# Patient Record
Sex: Female | Born: 1980 | Race: Black or African American | Hispanic: No | Marital: Single | Smoking: Never smoker
Health system: Southern US, Community
[De-identification: ages and names within clinical notes are randomized; demographics above are authoritative.]

## PROBLEM LIST (undated history)

## (undated) ENCOUNTER — Inpatient Hospital Stay (HOSPITAL_COMMUNITY): Payer: Self-pay

## (undated) DIAGNOSIS — I10 Essential (primary) hypertension: Secondary | ICD-10-CM

## (undated) HISTORY — PX: LIPOMA EXCISION: SHX5283

---

## 2018-02-03 LAB — OB RESULTS CONSOLE RUBELLA ANTIBODY, IGM: Rubella: IMMUNE

## 2018-02-03 LAB — OB RESULTS CONSOLE HIV ANTIBODY (ROUTINE TESTING): HIV: NONREACTIVE

## 2018-02-07 LAB — OB RESULTS CONSOLE GC/CHLAMYDIA
Chlamydia: NEGATIVE
Gonorrhea: NEGATIVE

## 2018-03-30 NOTE — L&D Delivery Note (Signed)
Delivery Note At  a viable female was delivered via  (Presentation:OA ;  ).  APGAR:8 ,9 ; weight pending  .   Placenta status:complete , . 3v Cord:  with the following complications:heart shaped placenta .  Anesthesia:  Epidural Episiotomy:  None Lacerations:  None Suture Repair:NA Est. Blood Loss (mL):100cc    Mom to postpartum.  Baby to Couplet care / Skin to Skin.  Renee Ellis 07/28/2018, 12:55 AM

## 2018-04-13 ENCOUNTER — Encounter (HOSPITAL_COMMUNITY): Payer: Self-pay | Admitting: *Deleted

## 2018-04-13 ENCOUNTER — Other Ambulatory Visit: Payer: Self-pay

## 2018-04-13 ENCOUNTER — Inpatient Hospital Stay (HOSPITAL_COMMUNITY)
Admission: AD | Admit: 2018-04-13 | Discharge: 2018-04-13 | Disposition: A | Discharge status: Home / Self Care | Payer: 59 | Attending: Obstetrics and Gynecology | Admitting: Obstetrics and Gynecology

## 2018-04-13 DIAGNOSIS — O26872 Cervical shortening, second trimester: Secondary | ICD-10-CM | POA: Diagnosis not present

## 2018-04-13 DIAGNOSIS — Z3A24 24 weeks gestation of pregnancy: Secondary | ICD-10-CM | POA: Diagnosis not present

## 2018-04-13 DIAGNOSIS — Z369 Encounter for antenatal screening, unspecified: Secondary | ICD-10-CM | POA: Diagnosis not present

## 2018-04-13 DIAGNOSIS — Z362 Encounter for other antenatal screening follow-up: Secondary | ICD-10-CM | POA: Diagnosis not present

## 2018-04-13 DIAGNOSIS — Z3402 Encounter for supervision of normal first pregnancy, second trimester: Secondary | ICD-10-CM | POA: Diagnosis not present

## 2018-04-13 DIAGNOSIS — O26873 Cervical shortening, third trimester: Secondary | ICD-10-CM | POA: Insufficient documentation

## 2018-04-13 DIAGNOSIS — Z113 Encounter for screening for infections with a predominantly sexual mode of transmission: Secondary | ICD-10-CM | POA: Diagnosis not present

## 2018-04-13 DIAGNOSIS — Z3A34 34 weeks gestation of pregnancy: Secondary | ICD-10-CM | POA: Diagnosis not present

## 2018-04-13 DIAGNOSIS — N883 Incompetence of cervix uteri: Secondary | ICD-10-CM

## 2018-04-13 LAB — URINALYSIS, ROUTINE W REFLEX MICROSCOPIC
Bilirubin Urine: NEGATIVE
Glucose, UA: NEGATIVE mg/dL
Ketones, ur: 20 mg/dL — AB
LEUKOCYTES UA: NEGATIVE
Nitrite: NEGATIVE
Protein, ur: 30 mg/dL — AB
Specific Gravity, Urine: 1.025 (ref 1.005–1.030)
pH: 6 (ref 5.0–8.0)

## 2018-04-13 LAB — OB RESULTS CONSOLE GBS: GBS: POSITIVE

## 2018-04-13 MED ORDER — BETAMETHASONE SOD PHOS & ACET 6 (3-3) MG/ML IJ SUSP
12.0000 mg | Freq: Once | INTRAMUSCULAR | Status: AC
  Administered 2018-04-13: 12 mg via INTRAMUSCULAR
  Filled 2018-04-13: qty 2

## 2018-04-13 NOTE — Discharge Instructions (Signed)
Activity Restriction During Pregnancy °Your health care provider may recommend specific activity restrictions during pregnancy for a variety of reasons. Activity restriction may require that you limit activities that require great effort, such as exercise, lifting, or sex. °The type of activity restriction will vary for each person, depending on your risk or the problems you are having. Activity restriction may be recommended for a period of time until your baby is delivered. °Why are activity restrictions recommended? °Activity restriction may be recommended if: °· Your placenta is partially or completely covering the opening of your cervix (placenta previa). °· There is bleeding between the wall of the uterus and the amniotic sac in the first trimester of pregnancy (subchorionic hemorrhage). °· You went into labor too early (preterm labor). °· You have a history of miscarriage. °· You have a condition that causes high blood pressure during pregnancy (preeclampsia or eclampsia). °· You are pregnant with more than one baby. °· Your baby is not growing well. °What are the risks? °The risks depend on your specific restriction. Strict bed rest has the most physical and emotional risks and is no longer routinely recommended. Risks of strict bed rest include: °· Loss of muscle conditioning from not moving. °· Blood clots. °· Social isolation. °· Depression. °· Loss of income. °Talk with your health care team about activity restriction to decide if it is best for you and your baby. Even if you are having problems during your pregnancy, you may be able to continue with normal levels of activity with careful monitoring by your health care team. °Follow these instructions at home: °If needed, based on your overall health and the health of your baby, your health care provider will decide which type of activity restriction is right for you. Activity restrictions may include: °· Not lifting anything heavier than 10 pounds (4.5  kg). °· Avoiding activities that take a lot of physical effort. °· No lifting or straining. °· Resting in a sitting position or lying down for periods of time during the day. °Pelvic rest may be recommended along with activity restrictions. If pelvic rest is recommended, then: °· Do not have sex, an orgasm, or use sexual stimulation. °· Do not use tampons. Do not douche. Do not put anything into your vagina. °· Do not lift anything that is heavier than 10 lb (4.5 kg). °· Avoid activities that require a lot of effort. °· Avoid any activity in which your pelvic muscles could become strained, such as squatting. °Questions to ask your health care provider °· Why is my activity being limited? °· How will activity restrictions affect my body? °· Why is rest helpful for me and my baby? °· What activities can I do? °· When can I return to normal activities? °When should I seek immediate medical care? °Seek immediate medical care if you have: °· Vaginal bleeding. °· Vaginal discharge. °· Cramping pain in your lower abdomen. °· Regular contractions. °· A low, dull backache. °Summary °· Your health care provider may recommend specific activity restrictions during pregnancy for a variety of reasons. °· Activity restriction may require that you limit activities such as exercise, lifting, sex, or any other activity that requires great effort. °· Discuss the risks and benefits of activity restriction with your health care team to decide if it is best for you and your baby. °· Contact your health care provider right away if you think you are having contractions, or if you notice vaginal bleeding, discharge, or cramping. °This information is not   intended to replace advice given to you by your health care provider. Make sure you discuss any questions you have with your health care provider. °Document Released: 07/11/2010 Document Revised: 07/06/2017 Document Reviewed: 07/06/2017 °Elsevier Interactive Patient Education © 2019 Elsevier  Inc. ° ° °Cervical Insufficiency °Cervical insufficiency is when the cervix is weak and starts to open (dilate) and thin (efface) before the pregnancy is at term and before labor starts. This is also called incompetent cervix. It can happen during the second or third trimester when the fetus starts putting pressure on the cervix. Treatment may reduce the risk of problems for you and your baby. Cervical insufficiency can lead to: °· Loss of the baby (miscarriage). °· Breaking of the sac that holds the baby (amniotic sac). This is also called preterm premature rupture of the membranes,PPROM. °· The baby being born early (preterm birth). °What are the causes? °The cause of this condition is not well known. However, it may be caused by abnormalities in the cervix and other factors such as inflammation or infection. °What increases the risk? °This condition is more likely to develop if: °· You have a shorter cervix than normal. °· Your cervix was damaged or injured during a past pregnancy or surgery. °· You were born with a cervical defect. °· You have had a procedure done on the cervix, such as cervical biopsy. °· You have a history of: °? Cervical insufficiency. °? PPROM. °· You have ended several pregnancies through abortion. °· You were exposed to the drug diethylstilbestrol (DES). °What are the signs or symptoms? °Symptoms of this condition can vary. Sometimes there no symptoms for this condition, and at other times there are mild symptoms that start between weeks 14 and 20 of pregnancy. The symptoms may last several days or weeks. These symptoms include: °· Light spotting or bleeding from the vagina. °· Pelvic pressure. °· A change in vaginal discharge, such as changes from clear, white, or light yellow to pink or tan. °· Back pain. °· Abdominal pain or cramping. °How is this diagnosed? °This condition may be diagnosed based on: °· Your symptoms. °· Your medical history, including: °? Any problems during past  pregnancies, such as miscarriages. °? Any procedures performed on your cervix. °? Any history of cervical insufficiency. °During the second trimester, cervical insufficiency may be diagnosed based on: °· An ultrasound done with a probe inserted into your vagina (transvaginal ultrasound). °· A pelvic exam. °· Tests of fluid in the amniotic sac. This is done to rule out infection. °How is this treated? °This condition may be managed by: °· Limiting physical activity. °· Limiting activity at home or in the hospital. °· Pelvic rest. This means that there should be no sexual intercourse or placing anything in the vagina. °· A procedure to sew the cervix closed and prevent it from opening too early (cerclage). The stitches (sutures) are removed between weeks 36 and 38 to avoid problems during labor. Cerclage may be recommended if: °? You have a history of miscarriages or preterm births without a known cause. °? You have a short cervix. A short cervix is identified by ultrasound. °? Your cervix has dilated before 24 weeks of pregnancy. °Follow these instructions at home: °· Get plenty of rest and lessen activity as told by your health care provider. Ask your health care provider what activities are safe for you. °· If pelvic rest was recommended, you shouldnot have sex, use tampons, douche, or place anything inside your vagina until your health   care provider says that this is okay. °· Take over-the-counter and prescription medicines only as told by your health care provider. °· Keep all follow-up visits and prenatal visits as told by your health care provider. This is important. °Get help right away if: °· You have vaginal bleeding, even if it is a small amount or even if it is painless. °· You have pain in your abdomen or your lower back. °· You have a feeling of increased pressure in your pelvis. °· You have vaginal discharge that changes from clear, white, or light yellow to pink or tan. °· You have a fever. °· You have  severe nausea or vomiting. °Summary °· Cervical insufficiency is when the cervix is weak and starts to dilate and efface before the pregnancy is at term and before labor starts. °· Symptoms of this condition can vary from no symptoms to mild symptoms that start between weeks 14 and 20 of pregnancy. The symptoms may last several days or weeks. °· This condition may be managed by limiting physical activity, having pelvic rest, or having cervical cerclage. °· If pelvic rest was recommended, you should not have sex, use tampons, use a douche, or place anything inside your vagina until your health care provider says that this is okay. °This information is not intended to replace advice given to you by your health care provider. Make sure you discuss any questions you have with your health care provider. °Document Released: 03/16/2005 Document Revised: 03/19/2016 Document Reviewed: 03/19/2016 °Elsevier Interactive Patient Education © 2019 [employer]vier Inc. ° ° °Cervical Cerclage ° °Cervical cerclage is a surgical procedure to correct a cervix that opens up and thins out before pregnancy is at term (cervical insufficiency, also called incompetent cervix). This condition can cause labor to start early (prematurely). This procedure involves using stitches to sew the cervix shut during pregnancy. °Your surgeon may use ultrasound equipment to help guide the procedure and monitor your baby. Ultrasound equipment uses sound waves to take images of your cervix and uterus. Your surgeon will assess these images on a monitor in the operating room. °Tell a health care provider about: °· Any allergies you have, especially any allergies related to prescribed medicine, stitches, or anesthetic medicines. °· All medicines you are taking, including vitamins, herbs, eye drops, creams, and over-the-counter medicines. Bring a list of all of your medicines to your appointment. °· Your medical history, including prior labor deliveries. °· Any  problems you or family members have had with anesthetic medicines. °· Any blood disorders you have. °· Any surgeries you have had, including prior cervical stitching. °· Any medical conditions you have. °· Whether you are pregnant or may be pregnant. °What are the risks? °Generally, this is a safe procedure. However, problems may occur, including: °· Infection, such as infection of the cervix or amniotic sac. °· Vaginal bleeding. °· Allergic reactions to medicines. °· Damage to other structures or organs, such as tearing (rupture) of membranes or cervical laceration. °· Premature contractions including going into early labor and delivery. °· Cervical dystocia, which occurs when the cervix is unable to dilate normally during labor. °What happens before the procedure? °Staying hydrated °Follow instructions from your health care provider about hydration, which may include: °· Up to 2 hours before the procedure - you may continue to drink clear liquids, such as water, clear fruit juice, black coffee, and plain tea. °Eating and drinking restrictions °Follow instructions from your health care provider about eating and drinking, which may include: °· 8   hours before the procedure - stop eating heavy meals or foods such as meat, fried foods, or fatty foods. °· 6 hours before the procedure - stop eating light meals or foods, such as toast or cereal. °· 6 hours before the procedure - stop drinking milk or drinks that contain milk. °· 2 hours before the procedure - stop drinking clear liquids. °Medicines °· Ask your health care provider about: °? Changing or stopping your regular medicines. This is especially important if you are taking diabetes medicines or blood thinners. °? Taking medicines such as aspirin and ibuprofen. These medicines can thin your blood. Do not take these medicines before your procedure if your health care provider instructs you not to. °· You may be given antibiotic medicine to help prevent  infection. °General instructions °· Do not put on any lotion, deodorant, or perfume. °· Remove contact lenses and jewelry. °· Ask your health care provider how your surgical site will be marked or identified. °· You may have an exam or testing. °· You may have a blood or urine sample taken. °· Plan to have someone take you home from the hospital or clinic. °· If you will be going home right after the procedure, plan to have someone with you for 24 hours. °What happens during the procedure? °· To reduce your risk of infection: °? Your health care team will wash or sanitize their hands. °? Your skin will be washed with soap. °· An IV tube will be inserted into one of your veins. °· You may be given one or more of the following: °? A medicine to help you relax (sedative). °? A medicine to numb the area (local anesthetic). °? A medicine to make you fall asleep (general anesthetic). °? A medicine that is injected into your spine to numb the area below and slightly above the injection site (spinal anesthetic). °? A medicine that is injected into an area of your body to numb everything below the injection site (regional anesthetic). °· A lubricated instrument (speculum) will be inserted into your vagina. The speculum will be widened to open the walls of your vagina so your surgeon can see your cervix. °· Your cervix will be grasped and tightly stitched closed (sutured). To do this, your surgeon will stitch a strong band of thread around your cervix, then the thread will be tightened to hold your cervix shut. °The procedure may vary among health care providers and hospitals. °What happens after the procedure? °· Your blood pressure, heart rate, breathing rate, and blood oxygen level will be monitored until the medicines you were given have worn off. You will be monitored for premature contractions. °· You may have light bleeding and mild cramping. °· You may have to wear compression stockings. These stockings help to  prevent blood clots and reduce swelling in your legs. °· Do not drive for 24 hours if you received a sedative. °· You may be put on bed rest. °· You may be given medicine to prevent infection. °· You may be given an injection of a hormone (progesterone) to prevent your uterus from tightening (contracting). °Summary °· Cervical cerclage is a surgical procedure that involves using stitches to sew the cervix shut during pregnancy. °· Your blood pressure, heart rate, breathing rate, and blood oxygen level will be monitored until the medicines you were given have worn off. You will be monitored for premature contractions. °· You may need to be on bed rest after the procedure. °· Plan to have someone   take you home from the hospital or clinic. °This information is not intended to replace advice given to you by your health care provider. Make sure you discuss any questions you have with your health care provider. °Document Released: 02/27/2008 Document Revised: 11/08/2015 Document Reviewed: 10/31/2015 °Elsevier Interactive Patient Education © 2019 Elsevier Inc. ° °

## 2018-04-13 NOTE — MAU Provider Note (Addendum)
Chief Complaint:  Contractions   None    HPI: Renee CooperSheena Ellis is a 38 y.o. G3P2000 at 6652w4d who presents to maternity admissions reporting sent from [facility] office for short cervic 1.6cm, 1/30%/high, Dr Renee Ellis requested monitor for cxt, and B<Z to be started, pending Ffn and GBS at office. .  Denies contractions, leakage of fluid or vaginal bleeding. Good fetal movement.   Pregnancy Course:   History reviewed. No pertinent past medical history. OB History  Gravida Para Term Preterm AB Living  3 2 2         SAB TAB Ectopic Multiple Live Births               # Outcome Date GA Lbr Len/2nd Weight Sex Delivery Anes PTL Lv  3 Current           2 Term      Vag-Spont     1 Term      Vag-Spont      Past Surgical History:  Procedure Laterality Date  . LIPOMA EXCISION     History reviewed. No pertinent family history. Social History   Tobacco Use  . Smoking status: Never Smoker  . Smokeless tobacco: Never Used  Substance Use Topics  . Alcohol use: Not Currently    Comment: prior to pregnancy  . Drug use: Never   No Known Allergies No medications prior to admission.    I have reviewed patient's Past Medical Hx, Surgical Hx, Family Hx, Social Hx, medications and allergies.   ROS:  Review of Systems  Constitutional: Negative.   Respiratory: Negative.   Gastrointestinal: Negative.   Genitourinary: Negative.   Skin: Negative.   Neurological: Negative.   Hematological: Negative.   Psychiatric/Behavioral: Negative.     Physical Exam   Patient Vitals for the past 24 hrs:  BP Temp Temp src Pulse Resp SpO2 Weight  04/13/18 2014 118/74 - - 94 16 - -  04/13/18 1836 132/77 98.4 F (36.9 C) Oral (!) 104 17 100 % 86.8 kg   Constitutional: Well-developed, well-nourished female in no acute distress.  Cardiovascular: normal rate Respiratory: normal effort GI: Abd soft, non-tender, gravid appropriate for gestational age. Pos BS x 4 MS: Extremities nontender, no edema, normal  ROM Neurologic: Alert and oriented x 4.  GU: Neg CVAT.  Pelvic: NEFG, physiologic discharge, no blood. Pelvic adequate for labor. No CMT  Dilation: 1 Effacement (%): 30 Station: [employer] Exam by:: Eaton CorporationJade Rameen Ellis, CNM   NST: FHR baseline 125 bpm, Variability: moderate, Accelerations:present, Decelerations:  Absent= Cat 1/Reactive UC:   none SVE:   Dilation: 1 Effacement (%): 30 Station: [employer] Exam by:: Eaton CorporationJade Revonda Ellis, CNM,   Labs: Results for orders placed or performed during the hospital encounter of 04/13/18 (from the past 24 hour(s))  Urinalysis, Routine w reflex microscopic     Status: Abnormal   Collection Time: 04/13/18  7:03 PM  Result Value Ref Range   Color, Urine YELLOW YELLOW   APPearance CLEAR CLEAR   Specific Gravity, Urine 1.025 1.005 - 1.030   pH 6.0 5.0 - 8.0   Glucose, UA NEGATIVE NEGATIVE mg/dL   Hgb urine dipstick MODERATE (A) NEGATIVE   Bilirubin Urine NEGATIVE NEGATIVE   Ketones, ur 20 (A) NEGATIVE mg/dL   Protein, ur 30 (A) NEGATIVE mg/dL   Nitrite NEGATIVE NEGATIVE   Leukocytes, UA NEGATIVE NEGATIVE   RBC / HPF 6-10 0 - 5 RBC/hpf   WBC, UA 0-5 0 - 5 WBC/hpf   Bacteria, UA RARE (A) NONE  SEEN   Squamous Epithelial / LPF 0-5 0 - 5   Mucus PRESENT     Imaging:  No results found. [place] today at [facility]:  MAU Course: Orders Placed This Encounter  Procedures  . Urinalysis, Routine w reflex microscopic  . Diet - low sodium heart healthy  . Fetal monitoring  . Increase activity slowly  . Call MD for:  . Call MD for:  temperature >100.4  . Call MD for:  persistant nausea and vomiting  . Call MD for:  severe uncontrolled pain  . Call MD for:  redness, tenderness, or signs of infection (pain, swelling, redness, odor or green/yellow discharge around incision site)  . Call MD for:  difficulty breathing, headache or visual disturbances  . Call MD for:  hives  . Call MD for:  persistant dizziness or light-headedness  . Call MD for:  extreme fatigue  .  (HEART FAILURE PATIENTS) Call MD:  Anytime you have any of the following symptoms: 1) 3 pound weight gain in 24 hours or 5 pounds in 1 week 2) shortness of breath, with or without a dry hacking cough 3) swelling in the hands, feet or stomach 4) if you have to sleep on extra pillows at night in order to breathe.  . Discharge patient Discharge disposition: 01-Home or Self Care; Discharge patient date: 04/13/2018   Meds ordered this encounter  Medications  . betamethasone acetate-betamethasone sodium phosphate (CELESTONE) injection 12 mg    MDM: PE, [place] reviewed, VS reviewed, NSt reactive, no cxt, stable, discharged home, BMZ started.   Assessment: 1. Short cervix   No cxt, no cervical change, x1 dose bmz given tonight. NST reactive  Plan: Discharge home in stable condition.  Labor precautions and fetal kick counts Return for 2nd BMZ on 1/16 Pending Ffn, GBS.  Follow-up Information    [facility]. Call.   Specialty:  Obstetrics and Gynecology Why:  [facility], please f/u in 1 weeks for repeat US, and ROB. Please repurt to the MAU in 1 day 24 hours for second dose of betamethasone Contact information: [address] [place] [address] [phone]          Allergies as of 04/13/2018   No Known Allergies     Medication List    You have not been prescribed any medications.     [facility] NP-C, CNM [place], [place] 04/13/2018 8:27 PM

## 2018-04-13 NOTE — MAU Note (Signed)
Had normal dr's appt today.  Had [place], cervix looked a little contractions.  Dr told her cervix felt a little thin and a little open, and that she was having contractions, pt was unaware.  Sent over for further monitoring.  Started cramping after exam

## 2018-04-14 ENCOUNTER — Inpatient Hospital Stay (HOSPITAL_COMMUNITY)
Admission: AD | Admit: 2018-04-14 | Discharge: 2018-04-14 | Disposition: A | Discharge status: Home / Self Care | Payer: 59 | Attending: Obstetrics and Gynecology | Admitting: Obstetrics and Gynecology

## 2018-04-14 DIAGNOSIS — O26872 Cervical shortening, second trimester: Secondary | ICD-10-CM | POA: Insufficient documentation

## 2018-04-14 DIAGNOSIS — N883 Incompetence of cervix uteri: Secondary | ICD-10-CM

## 2018-04-14 DIAGNOSIS — Z3A25 25 weeks gestation of pregnancy: Secondary | ICD-10-CM | POA: Insufficient documentation

## 2018-04-14 MED ORDER — BETAMETHASONE SOD PHOS & ACET 6 (3-3) MG/ML IJ SUSP
12.0000 mg | Freq: Once | INTRAMUSCULAR | Status: AC
  Administered 2018-04-14: 12 mg via INTRAMUSCULAR
  Filled 2018-04-14: qty 2

## 2018-04-14 NOTE — MAU Note (Signed)
Pt here for 2nd BMZ. Pt denies contractions, LOF or vaginal bleeding. Reports good movement.

## 2018-04-18 DIAGNOSIS — O26879 Cervical shortening, unspecified trimester: Secondary | ICD-10-CM | POA: Diagnosis not present

## 2018-04-18 DIAGNOSIS — Z3A25 25 weeks gestation of pregnancy: Secondary | ICD-10-CM | POA: Diagnosis not present

## 2018-04-18 DIAGNOSIS — O402XX1 Polyhydramnios, second trimester, fetus 1: Secondary | ICD-10-CM | POA: Diagnosis not present

## 2018-05-10 DIAGNOSIS — O26879 Cervical shortening, unspecified trimester: Secondary | ICD-10-CM | POA: Diagnosis not present

## 2018-05-10 DIAGNOSIS — O403XX1 Polyhydramnios, third trimester, fetus 1: Secondary | ICD-10-CM | POA: Diagnosis not present

## 2018-05-10 DIAGNOSIS — Z3A28 28 weeks gestation of pregnancy: Secondary | ICD-10-CM | POA: Diagnosis not present

## 2018-06-02 DIAGNOSIS — O26879 Cervical shortening, unspecified trimester: Secondary | ICD-10-CM | POA: Diagnosis not present

## 2018-06-02 DIAGNOSIS — Z3A31 31 weeks gestation of pregnancy: Secondary | ICD-10-CM | POA: Diagnosis not present

## 2018-06-13 DIAGNOSIS — O09529 Supervision of elderly multigravida, unspecified trimester: Secondary | ICD-10-CM | POA: Diagnosis not present

## 2018-06-13 DIAGNOSIS — Z0183 Encounter for blood typing: Secondary | ICD-10-CM | POA: Diagnosis not present

## 2018-06-13 DIAGNOSIS — O10019 Pre-existing essential hypertension complicating pregnancy, unspecified trimester: Secondary | ICD-10-CM | POA: Diagnosis not present

## 2018-06-13 DIAGNOSIS — Z3A33 33 weeks gestation of pregnancy: Secondary | ICD-10-CM | POA: Diagnosis not present

## 2018-06-13 DIAGNOSIS — O43199 Other malformation of placenta, unspecified trimester: Secondary | ICD-10-CM | POA: Diagnosis not present

## 2018-06-13 DIAGNOSIS — O09899 Supervision of other high risk pregnancies, unspecified trimester: Secondary | ICD-10-CM | POA: Diagnosis not present

## 2018-06-27 DIAGNOSIS — Z3A35 35 weeks gestation of pregnancy: Secondary | ICD-10-CM | POA: Diagnosis not present

## 2018-06-27 DIAGNOSIS — O43199 Other malformation of placenta, unspecified trimester: Secondary | ICD-10-CM | POA: Diagnosis not present

## 2018-06-27 DIAGNOSIS — Z3493 Encounter for supervision of normal pregnancy, unspecified, third trimester: Secondary | ICD-10-CM | POA: Diagnosis not present

## 2018-07-13 DIAGNOSIS — O43199 Other malformation of placenta, unspecified trimester: Secondary | ICD-10-CM | POA: Diagnosis not present

## 2018-07-13 DIAGNOSIS — Z362 Encounter for other antenatal screening follow-up: Secondary | ICD-10-CM | POA: Diagnosis not present

## 2018-07-13 DIAGNOSIS — Z3A37 37 weeks gestation of pregnancy: Secondary | ICD-10-CM | POA: Diagnosis not present

## 2018-07-27 ENCOUNTER — Inpatient Hospital Stay (HOSPITAL_COMMUNITY): Payer: 59 | Admitting: Anesthesiology

## 2018-07-27 ENCOUNTER — Encounter (HOSPITAL_COMMUNITY): Payer: Self-pay | Admitting: *Deleted

## 2018-07-27 ENCOUNTER — Other Ambulatory Visit: Payer: Self-pay

## 2018-07-27 ENCOUNTER — Inpatient Hospital Stay (HOSPITAL_COMMUNITY)
Admission: AD | Admit: 2018-07-27 | Discharge: 2018-07-29 | DRG: 806 | Disposition: A | Discharge status: Home / Self Care | Payer: 59 | Attending: Obstetrics and Gynecology | Admitting: Obstetrics and Gynecology

## 2018-07-27 DIAGNOSIS — O99824 Streptococcus B carrier state complicating childbirth: Secondary | ICD-10-CM | POA: Diagnosis present

## 2018-07-27 DIAGNOSIS — O9081 Anemia of the puerperium: Secondary | ICD-10-CM | POA: Diagnosis not present

## 2018-07-27 DIAGNOSIS — Z3A39 39 weeks gestation of pregnancy: Secondary | ICD-10-CM

## 2018-07-27 DIAGNOSIS — O43123 Velamentous insertion of umbilical cord, third trimester: Secondary | ICD-10-CM | POA: Diagnosis present

## 2018-07-27 DIAGNOSIS — O164 Unspecified maternal hypertension, complicating childbirth: Secondary | ICD-10-CM | POA: Diagnosis not present

## 2018-07-27 DIAGNOSIS — O1002 Pre-existing essential hypertension complicating childbirth: Secondary | ICD-10-CM | POA: Diagnosis present

## 2018-07-27 DIAGNOSIS — O43193 Other malformation of placenta, third trimester: Secondary | ICD-10-CM | POA: Diagnosis present

## 2018-07-27 DIAGNOSIS — Z22330 Carrier of Group B streptococcus: Secondary | ICD-10-CM | POA: Diagnosis not present

## 2018-07-27 HISTORY — DX: Essential (primary) hypertension: I10

## 2018-07-27 LAB — CBC
HCT: 37.3 % (ref 36.0–46.0)
Hemoglobin: 12.1 g/dL (ref 12.0–15.0)
MCH: 28.3 pg (ref 26.0–34.0)
MCHC: 32.4 g/dL (ref 30.0–36.0)
MCV: 87.1 fL (ref 80.0–100.0)
Platelets: 281 10*3/uL (ref 150–400)
RBC: 4.28 MIL/uL (ref 3.87–5.11)
RDW: 14 % (ref 11.5–15.5)
WBC: 8.2 10*3/uL (ref 4.0–10.5)
nRBC: 0 % (ref 0.0–0.2)

## 2018-07-27 LAB — TYPE AND SCREEN
ABO/RH(D): O POS
Antibody Screen: NEGATIVE

## 2018-07-27 MED ORDER — FENTANYL-BUPIVACAINE-NACL 0.5-0.125-0.9 MG/250ML-% EP SOLN
12.0000 mL/h | EPIDURAL | Status: DC | PRN
  Filled 2018-07-27: qty 250

## 2018-07-27 MED ORDER — DIPHENHYDRAMINE HCL 50 MG/ML IJ SOLN: 12.5000 mg | INTRAMUSCULAR | Status: DC | PRN

## 2018-07-27 MED ORDER — EPHEDRINE 5 MG/ML INJ: 10.0000 mg | INTRAVENOUS | Status: DC | PRN

## 2018-07-27 MED ORDER — OXYTOCIN 40 UNITS IN NORMAL SALINE INFUSION - SIMPLE MED
1.0000 m[IU]/min | INTRAVENOUS | Status: DC
  Administered 2018-07-27: 2 m[IU]/min via INTRAVENOUS
  Filled 2018-07-27: qty 1000

## 2018-07-27 MED ORDER — PHENYLEPHRINE 40 MCG/ML (10ML) SYRINGE FOR IV PUSH (FOR BLOOD PRESSURE SUPPORT): 80.0000 ug | PREFILLED_SYRINGE | INTRAVENOUS | Status: DC | PRN

## 2018-07-27 MED ORDER — OXYCODONE-ACETAMINOPHEN 5-325 MG PO TABS: 2.0000 | ORAL_TABLET | ORAL | Status: DC | PRN

## 2018-07-27 MED ORDER — LACTATED RINGERS IV SOLN
500.0000 mL | Freq: Once | INTRAVENOUS | Status: AC
  Administered 2018-07-27: 500 mL via INTRAVENOUS

## 2018-07-27 MED ORDER — ONDANSETRON HCL 4 MG/2ML IJ SOLN: 4.0000 mg | Freq: Four times a day (QID) | INTRAMUSCULAR | Status: DC | PRN

## 2018-07-27 MED ORDER — TERBUTALINE SULFATE 1 MG/ML IJ SOLN: 0.2500 mg | Freq: Once | INTRAMUSCULAR | Status: DC | PRN

## 2018-07-27 MED ORDER — OXYTOCIN 40 UNITS IN NORMAL SALINE INFUSION - SIMPLE MED: 2.5000 [IU]/h | INTRAVENOUS | Status: DC

## 2018-07-27 MED ORDER — SOD CITRATE-CITRIC ACID 500-334 MG/5ML PO SOLN: 30.0000 mL | ORAL | Status: DC | PRN

## 2018-07-27 MED ORDER — ACETAMINOPHEN 325 MG PO TABS: 650.0000 mg | ORAL_TABLET | ORAL | Status: DC | PRN

## 2018-07-27 MED ORDER — SODIUM CHLORIDE 0.9 % IV SOLN
5.0000 10*6.[IU] | Freq: Once | INTRAVENOUS | Status: AC
  Administered 2018-07-27: 5 10*6.[IU] via INTRAVENOUS
  Filled 2018-07-27: qty 5

## 2018-07-27 MED ORDER — LIDOCAINE HCL (PF) 1 % IJ SOLN: 30.0000 mL | INTRAMUSCULAR | Status: DC | PRN

## 2018-07-27 MED ORDER — OXYCODONE-ACETAMINOPHEN 5-325 MG PO TABS: 1.0000 | ORAL_TABLET | ORAL | Status: DC | PRN

## 2018-07-27 MED ORDER — LACTATED RINGERS IV SOLN
INTRAVENOUS | Status: DC
  Administered 2018-07-27 (×2): via INTRAVENOUS

## 2018-07-27 MED ORDER — PENICILLIN G 3 MILLION UNITS IVPB - SIMPLE MED
3.0000 10*6.[IU] | INTRAVENOUS | Status: DC
  Administered 2018-07-27 (×2): 3 10*6.[IU] via INTRAVENOUS
  Filled 2018-07-27 (×2): qty 100

## 2018-07-27 MED ORDER — LIDOCAINE HCL (PF) 1 % IJ SOLN
INTRAMUSCULAR | Status: DC | PRN
  Administered 2018-07-27 (×2): 5 mL via EPIDURAL

## 2018-07-27 MED ORDER — SODIUM CHLORIDE (PF) 0.9 % IJ SOLN
INTRAMUSCULAR | Status: DC | PRN
  Administered 2018-07-27: 12 mL/h via EPIDURAL

## 2018-07-27 MED ORDER — LACTATED RINGERS IV SOLN: 500.0000 mL | INTRAVENOUS | Status: DC | PRN

## 2018-07-27 MED ORDER — OXYTOCIN BOLUS FROM INFUSION
500.0000 mL | Freq: Once | INTRAVENOUS | Status: AC
  Administered 2018-07-28: 500 mL via INTRAVENOUS

## 2018-07-27 MED ORDER — LACTATED RINGERS IV SOLN: 500.0000 mL | Freq: Once | INTRAVENOUS | Status: DC

## 2018-07-27 NOTE — Anesthesia Procedure Notes (Signed)
Epidural Patient location during procedure: OB Start time: 07/27/2018 6:26 PM End time: 07/27/2018 6:35 PM  Staffing Anesthesiologist: Achille Rich, MD Performed: anesthesiologist   Preanesthetic Checklist Completed: patient identified, site marked, pre-op evaluation, timeout performed, IV checked, risks and benefits discussed and monitors and equipment checked  Epidural Patient position: sitting Prep: DuraPrep Patient monitoring: heart rate, cardiac monitor, continuous pulse ox and blood pressure Approach: midline Location: L2-L3 Injection technique: LOR saline  Needle:  Needle type: Tuohy  Needle gauge: 17 G Needle length: 9 cm Needle insertion depth: 6 cm Catheter type: closed end flexible Catheter size: 19 Gauge Catheter at skin depth: 12 cm Test dose: negative and Other  Assessment Events: blood not aspirated, injection not painful, no injection resistance and negative IV test  Additional Notes Informed consent obtained prior to proceeding including risk of failure, 1% risk of PDPH, risk of minor discomfort and bruising.  Discussed rare but serious complications including epidural abscess, permanent nerve injury, epidural hematoma.  Discussed alternatives to epidural analgesia and patient desires to proceed.  Timeout performed pre-procedure verifying patient name, procedure, and platelet count.  Patient tolerated procedure well. Reason for block:procedure for pain

## 2018-07-27 NOTE — Anesthesia Preprocedure Evaluation (Signed)
Anesthesia Evaluation  Patient identified by MRN, date of birth, ID band Patient awake    Reviewed: Allergy & Precautions, H&P , NPO status , Patient's Chart, lab work & pertinent test results  Airway Mallampati: II   Neck ROM: full    Dental   Pulmonary neg pulmonary ROS,    breath sounds clear to auscultation       Cardiovascular hypertension,  Rhythm:regular Rate:Normal     Neuro/Psych    GI/Hepatic   Endo/Other    Renal/GU      Musculoskeletal   Abdominal   Peds  Hematology   Anesthesia Other Findings   Reproductive/Obstetrics (+) Pregnancy                             Anesthesia Physical Anesthesia Plan  ASA: II  Anesthesia Plan: Epidural   Post-op Pain Management:    Induction: Intravenous  PONV Risk Score and Plan: 2 and Treatment may vary due to age or medical condition  Airway Management Planned: Natural Airway  Additional Equipment:   Intra-op Plan:   Post-operative Plan:   Informed Consent: I have reviewed the patients History and Physical, chart, labs and discussed the procedure including the risks, benefits and alternatives for the proposed anesthesia with the patient or authorized representative who has indicated his/her understanding and acceptance.     Plan Discussed with: Anesthesiologist  Anesthesia Plan Comments:         Anesthesia Quick Evaluation  

## 2018-07-27 NOTE — H&P (Signed)
Renee Ellis is a 38 y.o. female, G3P2, at 39+4 weeks sent from office by Dr Mora Appl  For IOL due to h/o hypertensive disorder  Pregnancy followed at [facility] since 17  weeks and remarkable for:  1. AMA: genetic testing declined 2. Marginal cord insertion: EFW 7 lbs at 37+4 weeks 53% normal AFI. Pelvis proven to 7 lbs 14 oz 3. Succenturiate placenta 4. Short cervix at 24 weeks: BMZ completed and remained on vaginal progesterone until 36 weeks 5. History of hypertensive disorder: no recent need for antihypertensive medication and remained with normal BP through this pregnancy 6. GBS + 7. Prenatal records discordant as to blood type: prenatal labs with O+ but noted in the chart as O-. Will repeat blood type today: confirmed O+ 8. Transient polyhydramnios from 24 weeks resolved at 31 weeks  OB History    Gravida  3   Para  2   Term  2   Preterm      AB      Living  2     SAB      TAB      Ectopic      Multiple      Live Births  2          Past Medical History:  Diagnosis Date  . Hypertension    Past Surgical History:  Procedure Laterality Date  . LIPOMA EXCISION      Family History:   Mother: breast cancer survivor  Mother and father: CHTN  Social History:    reports that she has never smoked. She has never used smokeless tobacco. She reports previous alcohol use. She reports that she does not use drugs.   Prenatal labs: ABO, Rh: --/--/PENDING (04/29 1305) Antibody: NEG (04/29 1305) Rubella: Immune (11/07 0000) RPR:   NR HBsAg:   NR HIV: Non-reactive (11/07 0000)  GBS: Positive (01/15 0000)    Prenatal Transfer Tool  Maternal Diabetes: No Genetic Screening: Declined Maternal Ultrasounds/Referrals: Normal Fetal Ultrasounds or other Referrals:  None Maternal Substance Abuse:  No Significant Maternal Medications:  None Significant Maternal Lab Results: None   Dilation: 4.5 Effacement (%): 80 Station: -2 Exam by:: Dr. Estanislado Pandy   AROM with clear  fluid   General Appearance: Alert, appropriate appearance for age. No acute distress HEENT Exam: Grossly normal Chest/Respiratory Exam: Normal chest wall and respirations. Clear to auscultation  Cardiovascular Exam: Regular rate and rhythm. S1, S2, no murmur Gastrointestinal Exam: soft, non-tender, Uterus gravid with size compatible with GA, Vertex presentation by Leopold's maneuvers Psychiatric Exam: Alert and oriented, appropriate affect    Fetal tracings: Category 1     Assessment/Plan:  G3P2 at 39+4 weeks with h/o CHTN, normotensive through prenatal care, here for IOL Expecting boy named Best boy Planning outpatient circumcision   Silverio Lay MD 07/27/2018, 2:52 PM

## 2018-07-27 NOTE — Progress Notes (Addendum)
Review of tracing which was cat 1 at 2128 is now cat 2 with minimal variabiity.  I called Bernerd Pho, CNM and she is going to check and evaluate the patient now.  Report once checked.  Pt is 8cm anticipating delivery soon.  Possible sleep cycle but positive scalp stimulation noted.  Will cont to observe. 2240 cat 1 tracing and baseline 120.

## 2018-07-27 NOTE — Progress Notes (Signed)
Subjective: Comfortable with epidural.  Support in room.  Objective: BP 132/80   Pulse 84   Temp 98.7 F (37.1 C) (Oral)   Resp 16   Ht 5\' 4"  (1.626 m)   Wt 88.9 kg   SpO2 96%   BMI 33.64 kg/m  No intake/output data recorded. No intake/output data recorded.  FHT: Category 1 FHT 125 accels, no decels,  UC:   regular, every 3-4 minutes SVE:   Dilation: 5.5 Effacement (%): 80 Station: -2 Exam by:: Mary [place] Johnson, RN  Pitocin at 10 mu   Assessment:  Pt is a G3P2002 at 39.4 weeks IUP in active labor Cat 1 strip GBS positive  Plan: Anticipate SVD  Kenney Houseman CNM, MSN 07/27/2018, 7:46 PM

## 2018-07-27 NOTE — Progress Notes (Signed)
Subjective: Pt feeling cramping with epidural.  Will push button.  Objective: BP 132/80   Pulse 84   Temp 98.7 F (37.1 C) (Oral)   Resp 16   Ht 5\' 4"  (1.626 m)   Wt 88.9 kg   SpO2 96%   BMI 33.64 kg/m  No intake/output data recorded. No intake/output data recorded.  FHT: Category 1 Fhts 115 variability present some accels noted UC:   regular, every 2-3 minutes SVE:   8/100/0 Pitocin at 10 mu   Assessment:  G3P2002 in active labor Cat 1 strip GBS positive  Plan: Anticipate SVD  Kenney Houseman CNM, MSN 07/27/2018, 10:16 PM

## 2018-07-28 ENCOUNTER — Inpatient Hospital Stay (HOSPITAL_COMMUNITY): Payer: 59

## 2018-07-28 LAB — CBC
HCT: 33.4 % — ABNORMAL LOW (ref 36.0–46.0)
HCT: 28.9 % — ABNORMAL LOW (ref 36.0–46.0)
Hemoglobin: 10.9 g/dL — ABNORMAL LOW (ref 12.0–15.0)
Hemoglobin: 9.4 g/dL — ABNORMAL LOW (ref 12.0–15.0)
MCH: 28.2 pg (ref 26.0–34.0)
MCH: 28.3 pg (ref 26.0–34.0)
MCHC: 32.6 g/dL (ref 30.0–36.0)
MCHC: 32.5 g/dL (ref 30.0–36.0)
MCV: 86.5 fL (ref 80.0–100.0)
MCV: 87 fL (ref 80.0–100.0)
Platelets: 241 10*3/uL (ref 150–400)
Platelets: 233 10*3/uL (ref 150–400)
RBC: 3.86 MIL/uL — ABNORMAL LOW (ref 3.87–5.11)
RBC: 3.32 MIL/uL — ABNORMAL LOW (ref 3.87–5.11)
RDW: 13.9 % (ref 11.5–15.5)
RDW: 13.9 % (ref 11.5–15.5)
WBC: 13.4 10*3/uL — ABNORMAL HIGH (ref 4.0–10.5)
WBC: 17 10*3/uL — ABNORMAL HIGH (ref 4.0–10.5)
nRBC: 0 % (ref 0.0–0.2)
nRBC: 0 % (ref 0.0–0.2)

## 2018-07-28 LAB — RPR: RPR Ser Ql: NONREACTIVE

## 2018-07-28 MED ORDER — TRANEXAMIC ACID-NACL 1000-0.7 MG/100ML-% IV SOLN
1000.0000 mg | INTRAVENOUS | Status: AC
  Administered 2018-07-28: 1000 mg via INTRAVENOUS

## 2018-07-28 MED ORDER — ZOLPIDEM TARTRATE 5 MG PO TABS: 5.0000 mg | ORAL_TABLET | Freq: Every evening | ORAL | Status: DC | PRN

## 2018-07-28 MED ORDER — COCONUT OIL OIL: 1.0000 "application " | TOPICAL_OIL | Status: DC | PRN

## 2018-07-28 MED ORDER — BENZOCAINE-MENTHOL 20-0.5 % EX AERO: 1.0000 "application " | INHALATION_SPRAY | CUTANEOUS | Status: DC | PRN

## 2018-07-28 MED ORDER — ONDANSETRON HCL 4 MG PO TABS: 4.0000 mg | ORAL_TABLET | ORAL | Status: DC | PRN

## 2018-07-28 MED ORDER — SIMETHICONE 80 MG PO CHEW: 80.0000 mg | CHEWABLE_TABLET | ORAL | Status: DC | PRN

## 2018-07-28 MED ORDER — ACETAMINOPHEN 325 MG PO TABS
650.0000 mg | ORAL_TABLET | ORAL | Status: DC | PRN
  Administered 2018-07-28 (×2): 650 mg via ORAL
  Filled 2018-07-28 (×2): qty 2

## 2018-07-28 MED ORDER — ONDANSETRON HCL 4 MG/2ML IJ SOLN: 4.0000 mg | INTRAMUSCULAR | Status: DC | PRN

## 2018-07-28 MED ORDER — DIPHENHYDRAMINE HCL 25 MG PO CAPS: 25.0000 mg | ORAL_CAPSULE | Freq: Four times a day (QID) | ORAL | Status: DC | PRN

## 2018-07-28 MED ORDER — METHYLERGONOVINE MALEATE 0.2 MG/ML IJ SOLN
0.2000 mg | INTRAMUSCULAR | Status: DC | PRN
  Administered 2018-07-28: 0.2 mg via INTRAMUSCULAR
  Filled 2018-07-28: qty 1

## 2018-07-28 MED ORDER — TRANEXAMIC ACID-NACL 1000-0.7 MG/100ML-% IV SOLN: 1000.0000 mg | Freq: Once | INTRAVENOUS | Status: DC | PRN

## 2018-07-28 MED ORDER — PRENATAL MULTIVITAMIN CH
1.0000 | ORAL_TABLET | Freq: Every day | ORAL | Status: DC
  Administered 2018-07-28: 1 via ORAL
  Filled 2018-07-28 (×3): qty 1

## 2018-07-28 MED ORDER — TRANEXAMIC ACID-NACL 1000-0.7 MG/100ML-% IV SOLN
INTRAVENOUS | Status: AC
  Administered 2018-07-28: 1000 mg via INTRAVENOUS
  Filled 2018-07-28: qty 100

## 2018-07-28 MED ORDER — WITCH HAZEL-GLYCERIN EX PADS: 1.0000 "application " | MEDICATED_PAD | CUTANEOUS | Status: DC | PRN

## 2018-07-28 MED ORDER — SENNOSIDES-DOCUSATE SODIUM 8.6-50 MG PO TABS
2.0000 | ORAL_TABLET | ORAL | Status: DC
  Administered 2018-07-28: 2 via ORAL
  Filled 2018-07-28: qty 2

## 2018-07-28 MED ORDER — SODIUM CHLORIDE 0.9 % IV SOLN
3.0000 g | Freq: Four times a day (QID) | INTRAVENOUS | Status: AC
  Administered 2018-07-28 (×4): 3 g via INTRAVENOUS
  Filled 2018-07-28 (×5): qty 3

## 2018-07-28 MED ORDER — MISOPROSTOL 200 MCG PO TABS
1000.0000 ug | ORAL_TABLET | Freq: Once | ORAL | Status: AC
  Administered 2018-07-28: 1000 ug via RECTAL

## 2018-07-28 MED ORDER — TETANUS-DIPHTH-ACELL PERTUSSIS 5-2.5-18.5 LF-MCG/0.5 IM SUSP: 0.5000 mL | Freq: Once | INTRAMUSCULAR | Status: DC

## 2018-07-28 MED ORDER — FENTANYL CITRATE (PF) 100 MCG/2ML IJ SOLN
50.0000 ug | Freq: Once | INTRAMUSCULAR | Status: AC
  Administered 2018-07-28: 50 ug via INTRAVENOUS

## 2018-07-28 MED ORDER — DIBUCAINE (PERIANAL) 1 % EX OINT: 1.0000 "application " | TOPICAL_OINTMENT | CUTANEOUS | Status: DC | PRN

## 2018-07-28 MED ORDER — MISOPROSTOL 200 MCG PO TABS
ORAL_TABLET | ORAL | Status: AC
  Administered 2018-07-28: 1000 ug via RECTAL
  Filled 2018-07-28: qty 5

## 2018-07-28 MED ORDER — LACTATED RINGERS IV BOLUS
500.0000 mL | Freq: Once | INTRAVENOUS | Status: AC
  Administered 2018-07-28: 500 mL via INTRAVENOUS

## 2018-07-28 MED ORDER — FENTANYL CITRATE (PF) 100 MCG/2ML IJ SOLN
INTRAMUSCULAR | Status: AC
  Administered 2018-07-28: 50 ug via INTRAVENOUS
  Filled 2018-07-28: qty 2

## 2018-07-28 MED ORDER — IBUPROFEN 600 MG PO TABS
600.0000 mg | ORAL_TABLET | Freq: Four times a day (QID) | ORAL | Status: DC
  Administered 2018-07-28 – 2018-07-29 (×6): 600 mg via ORAL
  Filled 2018-07-28 (×6): qty 1

## 2018-07-28 NOTE — Lactation Note (Signed)
This note was copied from a baby's chart. Lactation Consultation Note  Patient Name: Renee Ellis [id number] Date: 07/28/2018 Reason for consult: Initial assessment;Term;Infant weight loss  13 hours old FT female who is being exclusively BF by his mother, she's a P3 but not very experienced BF. Mom didn't BF her first child, and BF her second one for 1.5 weeks. She experienced some BF difficulties with second baby, she had GERD since birth and she also didn't have enough BF support. Mom already familiar with hand expression, when [facility] assisted with it, she was able to get big drops of colostrum, LC showed mom how to finger fed baby. Mom doesn't have a pump at home, but she's a [employer] employee and has [employer] and [employer]. She told LC that she has already requested her breastpump through the mail last week but she has not heard from [facility]. LC advised mom to call and check on the status of pump issuance to see if she can cancel that request and get a DEBP from the hospital instead.   Baby doing STS with mom when entering the room, offered assistance with latch and mom agreed to wake baby up to feed, said she tried to feed him a few minutes ago but baby was very sleepy due to the bath. LC took baby STS and kept him covered with blankets (due to being less than one hour from bath), but baby wouldn't latch, he woke up briefly and cried but when back to sleep. Baby is at 1% weight loss. An attempt was documented in Flowsheets. Asked mom to call for assistance when needed. Reviewed feeding cues and normal newborn behavior.  Feeding plan:  1. Encouraged mom to feed baby STS 8-12 times/24 hours or sooner if feeding cues are present 2. Hand expression and spoon/finger feeding were also encouraged  BF brochure, BF resources and feeding diary were reviewed. Mom reported all questions and concerns were answered, she's aware of LC OP services and will call PRN.  Maternal Data Formula Feeding for  Exclusion: No Has patient been taught Hand Expression?: Yes Does the patient have breastfeeding experience prior to this delivery?: Yes  Feeding Feeding Type: Breast Fed   Interventions Interventions: Breast feeding basics reviewed;Assisted with latch;Skin to skin;Breast massage;Breast compression;Hand express;Adjust position;Support pillows  Lactation Tools Discussed/Used WIC Program: No   Consult Status Consult Status: Follow-up Date: 07/29/18 Follow-up type: In-patient    Renee Ellis [name] 07/28/2018, 1:21 PM

## 2018-07-28 NOTE — Lactation Note (Signed)
This note was copied from a baby's chart. Lactation Consultation Note  Patient Name: Renee Malkia Piechocki [id number]'[id number] Date: 07/28/2018 Reason for consult: Mother's request;Difficult latch;Term P2. 23 hour female infant/ Mom's request due difficulties with past latches. Infant had 4 stools and 2 voids since delivery. Per mom, infant not eaten in past 13 hours. LC asked mom, hand express mom taught back and infant was given 3 ml of colostrum by spoon. Mom latched infant on left breast using the foot ball hold, infant latched well with wide gape, swallows observed, infant was still breastfeeding (15 minutes) when LC left the room. Mom knows to breastfeed according to hunger cues, 8 or more times within 24 hours. LC discussed cluster feeding at 24 hours with parents. Parents will continue to do as much STS as possible. Mom will call Nurse or LC if she has any questions, concerns or need assistance with latching infant to breast.   Maternal Data    Feeding    LATCH Score Latch: Grasps breast easily, tongue down, lips flanged, rhythmical sucking.  Audible Swallowing: A few with stimulation  Type of Nipple: Everted at rest and after stimulation(Short shafted  nipple)  Comfort (Breast/Nipple): Soft / non-tender  Hold (Positioning): Assistance needed to correctly position infant at breast and maintain latch.  LATCH Score: 8  Interventions Interventions: Skin to skin;Hand express;Expressed milk;Support pillows;Adjust position;Breast compression;Position options  Lactation Tools Discussed/Used     Consult Status Consult Status: Follow-up Date: 07/29/18 Follow-up type: In-patient    Danelle Earthly 07/28/2018, 11:56 PM

## 2018-07-28 NOTE — Progress Notes (Addendum)
S:  Called by RN to evaluate patient. Pt passed clot and had low BP.  Pt was feeling nauseated and dizzy with increase in cramping.  EBL per RN with adding delivery is at 875.  Pt was straight cath in L and D of 600cc urine.  RN gave  0.2 mg IM of methergine. 0:  Vitals:   07/28/18 0130 07/28/18 0225  BP: 117/81 122/84  Pulse: 84 (!) 103  Resp:  20  Temp:  98.6 F (37 C)  SpO2:      Fundus 2 above umbilicus Bladder scan with 100cc noted External massage with large amount of clots noted.  Code hemorrhage called.  IV bolus given.  Patient given 50 mcg of Fentanyl before sweep of uterus with return of clots.  cytotec 800 mcq per rectum.  TXA per IV. Bleeding controlled.  Patient stable.  EBL 2170, CBC 10.9 at 200 am.  Will repeat this am. Bedside US showed no retained placenta. A: PPH after SVD P:  Unasyn 3 Gm to be given due to manual sweep. Dr. Su Hilt aware of plan of care.

## 2018-07-28 NOTE — Anesthesia Postprocedure Evaluation (Signed)
Anesthesia Post Note  Patient: Sammuel Cooper  Procedure(s) Performed: AN AD HOC LABOR EPIDURAL     Patient location during evaluation: Mother Baby Anesthesia Type: Epidural Level of consciousness: awake and alert Pain management: pain level controlled Vital Signs Assessment: post-procedure vital signs reviewed and stable Respiratory status: spontaneous breathing, nonlabored ventilation and respiratory function stable Cardiovascular status: stable Postop Assessment: no headache, no backache, epidural receding, no apparent nausea or vomiting, patient able to bend at knees, able to ambulate and adequate PO intake Anesthetic complications: no    Last Vitals:  Vitals:   07/28/18 0225 07/28/18 0807  BP: 122/84 117/75  Pulse: (!) 103 99  Resp: 20 18  Temp: 37 C 37 C  SpO2:  99%    Last Pain:  Vitals:   07/28/18 0807  TempSrc: Oral  PainSc:    Pain Goal:                Epidural/Spinal Function Cutaneous sensation: Normal sensation (07/28/18 0752), Patient able to flex knees: Yes (07/28/18 0752), Patient able to lift hips off bed: Yes (07/28/18 0752), Back pain beyond tenderness at insertion site: No (07/28/18 0752), Progressively worsening motor and/or sensory loss: No (07/28/18 0752), Bowel and/or bladder incontinence post epidural: No (07/28/18 0752)  Laban Emperor

## 2018-07-28 NOTE — Progress Notes (Signed)
Pt without c/o BP 109/66 (BP Location: Right Arm)   Pulse 86   Temp 98.2 F (36.8 C) (Oral)   Resp 18   Ht 5\' 4"  (1.626 m)   Wt 88.9 kg   SpO2 100%   BMI 33.64 kg/m   Abd soft NT fundus is firm Minimal bleeding noted.   Pt doing well

## 2018-07-28 NOTE — Progress Notes (Signed)
5809 Received call from S. Kizzie Bane, RN that pt states she felt lightheaded and hot with a BP of 88/56 pulse of 63.  0307 this RN went to the room assessed pt's pad. Noted a orange sized clot on pt's peri pad with blood noted seeping through mesh underwear onto chux pad. BP 111/67. Pt states she felt better but she was still hot. Weight of that bleeding 275. Fundus firm with small trickle noted during fundal rub. 0320 0.2mg  Methergine given IM per order. 0322 BP 92/53 pulse 67. 0325 This RN called N. Prothero, CNM to inform her of pt's low BP (73/46) and large clot. Received order for 500cc bolus and and check CBG with CNM to follow up. 0333 BP 92/55 pulse 75.  0340 N. Prothero, CNM arrived to pt's bedside. Attempted to have pt void using bedpan; pt unable to void.  0350 BP 109/78 pulse 88. 0355 Tylenol 650mg  PO and Motrin 600mg  given per verbal order from CNM. 0400 N. Prothero, CNM expelled 633cc in clots during fundal rub. cytotec given rectally by CNM. Pt given Fentanyl prior to sweep of uterus by CNM. Code hemorrhage called.  94 Newborn taken to CN by S. Baird Lyons, Tech. 0425 TXA 1000mg  given IV bag. 0435 Bedside pelvic US performed. Bleeding controlled. Informed CNM that total EBL during code hemorrhage was 2170cc.  0500 This RN placed pt on bedpan. Pt able to void 50cc. CNM aware.Pt states she no longer feels lightheaded and "I just want to eat and go to sleep."  Will continue to monitor.

## 2018-07-29 DIAGNOSIS — O9081 Anemia of the puerperium: Secondary | ICD-10-CM

## 2018-07-29 LAB — TYPE AND SCREEN
ABO/RH(D): O POS
Antibody Screen: NEGATIVE
Unit division: 0
Unit division: 0

## 2018-07-29 LAB — BPAM RBC
Blood Product Expiration Date: 202005032359
Blood Product Expiration Date: 202005042359
Unit Type and Rh: 9500
Unit Type and Rh: 9500

## 2018-07-29 MED ORDER — FERROUS SULFATE 325 (65 FE) MG PO TBEC: 325.0000 mg | DELAYED_RELEASE_TABLET | Freq: Two times a day (BID) | ORAL | 3 refills | Status: DC

## 2018-07-29 MED ORDER — IBUPROFEN 600 MG PO TABS: 600.0000 mg | ORAL_TABLET | Freq: Four times a day (QID) | ORAL | 0 refills | Status: DC

## 2018-07-29 NOTE — Lactation Note (Signed)
This note was copied from a baby's chart. Lactation Consultation Note:  Mother request the Free Style Medela that was given to her . Mother assist with latching infant on the rt breast in football hold. Infant latched with deep latch and observed audible swallows.  Mother very appreciative of assistance. She is aware that she can follow up with OP,LC  Patient Name: Renee Ellis [id number] Date: 07/29/2018 Reason for consult: Follow-up assessment   Maternal Data    Feeding Feeding Type: Breast Fed  LATCH Score Latch: Grasps breast easily, tongue down, lips flanged, rhythmical sucking.  Audible Swallowing: Spontaneous and intermittent  Type of Nipple: Everted at rest and after stimulation  Comfort (Breast/Nipple): Soft / non-tender  Hold (Positioning): Assistance needed to correctly position infant at breast and maintain latch.  LATCH Score: 9  Interventions    Lactation Tools Discussed/Used     Consult Status Consult Status: Complete    Michel Bickers 07/29/2018, 3:40 PM

## 2018-07-29 NOTE — Lactation Note (Signed)
This note was copied from a baby's chart. Lactation Consultation Note:  Mother is a Producer, television/film/video. She wants to get her pump but is unsure which pump she wants. Mother advised to review the Medela site and page [facility] when ready for D/C.  Mother reports that she is slightly sore. She denies having any blister or cracking. She was given comfort gels.  Advised mother to rotate positions frequently from football to cross cradle and use good firm support.  Advised mother to nap when infant naps.   Advised mother to continue to cue base feed and to allow for cluster feeding. Mother to breastfeed at least 8-12 times or more in 24 hours.  Discussed treatment and prevention of engorgement .   Mother receptive to all teaching. Mother informed of all available LC services at [facility].  Patient Name: Renee Ellis [id number] Date: 07/29/2018 Reason for consult: Follow-up assessment   Maternal Data    Feeding Feeding Type: Breast Fed  LATCH Score Latch: (asked mom to call next time baby feeds)                 Interventions Interventions: Skin to skin;Hand express;Expressed milk;Comfort gels;Hand pump  Lactation Tools Discussed/Used     Consult Status Consult Status: Complete    Michel Bickers 07/29/2018, 12:15 PM

## 2018-07-29 NOTE — Discharge Summary (Signed)
SVD OB Discharge Summary     Patient Name: Renee Ellis DOB: 1980/09/02 MRN: [id number]  Date of admission: 07/27/2018 Delivering MD: Kenney Houseman  Date of delivery: 07/27/2018 Type of delivery: SVD  Newborn Data: Sex: Baby Female  Circumcision: out pt desired Live born female  Birth Weight: 8 lb (3629 g) APGAR: 9, 9  Newborn Delivery   Birth date/time:  07/27/2018 23:58:00 Delivery type:  Vaginal, Spontaneous     Feeding: breast Infant being discharge to home with mother in stable condition.   Admitting diagnosis: INDUCTION Intrauterine pregnancy: [redacted]w[redacted]d     Secondary diagnosis:  Active Problems:   Indication for care in labor or delivery   SVD (spontaneous vaginal delivery)   Normal postpartum course   Postpartum anemia                                Complications: None                                                              Intrapartum Procedures: spontaneous vaginal delivery and GBS prophylaxis Postpartum Procedures: none Complications-Operative and Postpartum: none Augmentation: AROM and Pitocin   History of Present Illness: Ms. Renee Ellis is a 38 y.o. female, G3P2002, who presents at [redacted]w[redacted]d weeks gestation. The patient has been followed at  [facility]  Her pregnancy has been complicated by:  Patient Active Problem List   Diagnosis Date Noted  . Normal postpartum course 07/29/2018  . Postpartum anemia 07/29/2018  . SVD (spontaneous vaginal delivery) 07/28/2018  . Indication for care in labor or delivery 07/27/2018    Hospital course:  Induction of Labor With Vaginal Delivery   38 y.o. yo G3P2002 at [redacted]w[redacted]d was admitted to the hospital 07/27/2018 for induction of labor.  Indication for induction: H/O CHTN, no meds, and normotensive through out pregnancy. .  Patient had an uncomplicated labor course as follows: Membrane Rupture Time/Date: 4:37 PM ,07/27/2018   Intrapartum Procedures: Episiotomy: None [1]                                          Lacerations:  None [1]  Patient had delivery of a Viable infant.  Information for the patient's newborn:  Renee, Ellis [[id number]]  Delivery Method: Vaginal, Spontaneous(Filed from Delivery Summary)   07/27/2018  Details of delivery can be found in separate delivery note.  Patient had a routine postpartum course. Patient is discharged home 07/29/18. Postpartum Day # 2 : S/P NSVD due to IOL for H/O CHTN with no meds, and normotensive throughout pregnancy. Patient up ad lib, denies syncope or dizziness. Reports consuming regular diet without issues and denies N/V. Patient reports 0 bowel movement + passing flatus.  Denies issues with urination and reports bleeding is "lighter."  Patient is breastfeeding and reports going well.  Desires mini pill for postpartum contraception.  Pain is being appropriately managed with use of po meds. Pt stable denies HA< RUQ pain or vision changes. Pt remains normotensive per pt. BP stable at 125/90 this morning.   Physical exam  Vitals:   07/28/18 1156 07/28/18  1606 07/28/18 2155 07/29/18 0528  BP: 109/66 125/80 124/78 125/90  Pulse: 86 95 97 86  Resp: 18 20 18 18   Temp: 98.2 F (36.8 C) 99.1 F (37.3 C) 98.4 F (36.9 C) 98 F (36.7 C)  TempSrc: Oral Oral Oral Oral  SpO2: 100% 99%    Weight:      Height:       General: alert, cooperative and no distress Lochia: appropriate Uterine Fundus: firm Perineum: Intact DVT Evaluation: No evidence of DVT seen on physical exam. Negative Homan's sign. No cords or calf tenderness. No significant calf/ankle edema.  Labs: Lab Results  Component Value Date   WBC 17.0 (H) 07/28/2018   HGB 9.4 (L) 07/28/2018   HCT 28.9 (L) 07/28/2018   MCV 87.0 07/28/2018   PLT 233 07/28/2018   No flowsheet data found.  Date of discharge: 07/29/2018 Discharge Diagnoses: Term Pregnancy-delivered Discharge instruction: per After Visit Summary and "Baby and Me Booklet".  After visit meds:   Allergies as of 07/29/2018   No Known Allergies     Medication List    TAKE these medications   ferrous sulfate 325 (65 FE) MG EC tablet Take 1 tablet (325 mg total) by mouth 2 (two) times daily.   ibuprofen 600 MG tablet Commonly known as:  ADVIL Take 1 tablet (600 mg total) by mouth every 6 (six) hours.   prenatal multivitamin Tabs tablet Take 1 tablet by mouth daily at 12 noon.       Activity:           unrestricted and pelvic rest Advance as tolerated. Pelvic rest for 6 weeks.  Diet:                routine Medications: PNV, Ibuprofen, Colace and Iron Postpartum contraception: Progesterone only pills Condition:  Pt discharge to home with baby in stable Anemia: Iron BID PO H/O CHTN: Monitor BP at home, report s/sx and BP>150/90s  Meds: Allergies as of 07/29/2018   No Known Allergies     Medication List    TAKE these medications   ferrous sulfate 325 (65 FE) MG EC tablet Take 1 tablet (325 mg total) by mouth 2 (two) times daily.   ibuprofen 600 MG tablet Commonly known as:  ADVIL Take 1 tablet (600 mg total) by mouth every 6 (six) hours.   prenatal multivitamin Tabs tablet Take 1 tablet by mouth daily at 12 noon.       Discharge Follow Up:  Follow-up Information    [facility] up [facility] 6 day(s).   Specialty:  Obstetrics and Gynecology Contact information: [address] [place] [phone]           MarionJade Krista Som, NP-C, CNM 07/29/2018, 8:56 AM  Dale DurhamJade Saralynn Langhorst, FNP

## 2018-07-31 LAB — GLUCOSE, CAPILLARY: Glucose-Capillary: 99 mg/dL (ref 70–99)

## 2018-09-06 DIAGNOSIS — Z3040 Encounter for surveillance of contraceptives, unspecified: Secondary | ICD-10-CM | POA: Diagnosis not present

## 2018-09-06 DIAGNOSIS — O906 Postpartum mood disturbance: Secondary | ICD-10-CM | POA: Diagnosis not present

## 2018-09-06 DIAGNOSIS — N898 Other specified noninflammatory disorders of vagina: Secondary | ICD-10-CM | POA: Diagnosis not present

## 2018-09-07 DIAGNOSIS — Z3009 Encounter for other general counseling and advice on contraception: Secondary | ICD-10-CM | POA: Diagnosis not present

## 2018-10-22 ENCOUNTER — Encounter (HOSPITAL_COMMUNITY): Payer: Self-pay

## 2018-12-08 DIAGNOSIS — Z30013 Encounter for initial prescription of injectable contraceptive: Secondary | ICD-10-CM | POA: Diagnosis not present

## 2018-12-27 DIAGNOSIS — F329 Major depressive disorder, single episode, unspecified: Secondary | ICD-10-CM | POA: Diagnosis not present

## 2018-12-27 DIAGNOSIS — Z1331 Encounter for screening for depression: Secondary | ICD-10-CM | POA: Diagnosis not present

## 2019-01-23 ENCOUNTER — Ambulatory Visit: Payer: 59 | Admitting: Family Medicine

## 2019-02-07 DIAGNOSIS — F329 Major depressive disorder, single episode, unspecified: Secondary | ICD-10-CM | POA: Diagnosis not present

## 2019-02-07 DIAGNOSIS — Z01419 Encounter for gynecological examination (general) (routine) without abnormal findings: Secondary | ICD-10-CM | POA: Diagnosis not present

## 2019-02-07 DIAGNOSIS — Z6831 Body mass index (BMI) 31.0-31.9, adult: Secondary | ICD-10-CM | POA: Diagnosis not present

## 2019-02-07 DIAGNOSIS — O906 Postpartum mood disturbance: Secondary | ICD-10-CM | POA: Diagnosis not present

## 2019-02-07 DIAGNOSIS — Z124 Encounter for screening for malignant neoplasm of cervix: Secondary | ICD-10-CM | POA: Diagnosis not present

## 2019-03-01 DIAGNOSIS — Z3040 Encounter for surveillance of contraceptives, unspecified: Secondary | ICD-10-CM | POA: Diagnosis not present

## 2019-03-13 ENCOUNTER — Ambulatory Visit: Payer: 59 | Admitting: Family Medicine

## 2019-03-14 ENCOUNTER — Encounter: Payer: Self-pay | Admitting: Family Medicine

## 2019-05-22 ENCOUNTER — Ambulatory Visit (INDEPENDENT_AMBULATORY_CARE_PROVIDER_SITE_OTHER): Payer: 59 | Admitting: Family Medicine

## 2019-05-22 ENCOUNTER — Encounter: Payer: Self-pay | Admitting: Family Medicine

## 2019-05-22 ENCOUNTER — Other Ambulatory Visit: Payer: Self-pay

## 2019-05-22 VITALS — BP 160/90 | HR 82 | Temp 98.4°F | Ht 63.5 in | Wt 187.2 lb

## 2019-05-22 DIAGNOSIS — R41840 Attention and concentration deficit: Secondary | ICD-10-CM | POA: Diagnosis not present

## 2019-05-22 DIAGNOSIS — Z3042 Encounter for surveillance of injectable contraceptive: Secondary | ICD-10-CM | POA: Diagnosis not present

## 2019-05-22 DIAGNOSIS — F53 Postpartum depression: Secondary | ICD-10-CM

## 2019-05-22 DIAGNOSIS — O99345 Other mental disorders complicating the puerperium: Secondary | ICD-10-CM

## 2019-05-22 NOTE — Progress Notes (Signed)
New Patient Office Visit  Subjective:  Patient ID: Renee Ellis, female    DOB: 07/24/1980  Age: 38 y.o. MRN: [id number]  CC:  Chief Complaint  Patient presents with  . Establish Care    med management     HPI Renee Ellis presents for   Postpartum depression Diagnosed by Ob/Gyn and initially started on zoloft.  Pt reports that she was started on Fluoxetine  She was prescribed Fluoxetine 20mg  since November 2020 She states that her dose helped her the first two months She was started off on zoloft but felt like she felt like she had a bad hangover the next day She states that with the fluoxetine she is sleepy but with the zoloft that was worse and this is why the medication was changed to fluoxetine.   Depression screen [facility] 2/9 05/22/2019  Decreased Interest 0  Down, Depressed, Hopeless 0  PHQ - 2 Score 0   EDD 07/27/2018 to a boy.  She is still breastfeeding and is very hands on.  She also has two daughters ages 85 and 19.  She did not do any counseling.  She reports history of postpartum hemorrhage.   She reports that she feels like her mood is better than before starting medication.  She reports that she has a hard time focusing and cannot read a book without her mind going somewhere. She states that a week ago she had a meeting at work and her mind went somewhere.   Contraception: she is on the Depo provera IM for contraception.   She is engaged.  She reports that her relationship is good and he works night.    Past Medical History:  Diagnosis Date  . Hypertension     Past Surgical History:  Procedure Laterality Date  . LIPOMA EXCISION      No family history on file.  Social History   Socioeconomic History  . Marital status: Single    Spouse name: Not on file  . Number of children: Not on file  . Years of education: Not on file  . Highest education level: Not on file  Occupational History  . Not on file  Tobacco Use  . Smoking status: Never Smoker    . Smokeless tobacco: Never Used  Substance and Sexual Activity  . Alcohol use: Not Currently    Comment: prior to pregnancy  . Drug use: Never  . Sexual activity: Yes  Other Topics Concern  . Not on file  Social History Narrative  . Not on file   Social Determinants of Health   Financial Resource Strain:   . Difficulty of Paying Living Expenses: Not on file  Food Insecurity:   . Worried About Charity fundraiser in the Last Year: Not on file  . Ran Out of Food in the Last Year: Not on file  Transportation Needs:   . Lack of Transportation (Medical): Not on file  . Lack of Transportation (Non-Medical): Not on file  Physical Activity:   . Days of Exercise per Week: Not on file  . Minutes of Exercise per Session: Not on file  Stress:   . Feeling of Stress : Not on file  Social Connections:   . Frequency of Communication with Friends and Family: Not on file  . Frequency of Social Gatherings with Friends and Family: Not on file  . Attends Religious Services: Not on file  . Active Member of Clubs or Organizations: Not on file  . Attends Club or  Organization Meetings: Not on file  . Marital Status: Not on file  Intimate Partner Violence:   . Fear of Current or Ex-Partner: Not on file  . Emotionally Abused: Not on file  . Physically Abused: Not on file  . Sexually Abused: Not on file    ROS Review of Systems Review of Systems  Constitutional: Negative for activity change, appetite change, chills and fever.  HENT: Negative for congestion, nosebleeds, trouble swallowing and voice change.   Respiratory: Negative for cough, shortness of breath and wheezing.   Gastrointestinal: Negative for diarrhea, nausea and vomiting.  Genitourinary: Negative for difficulty urinating, dysuria, flank pain and hematuria.  Musculoskeletal: Negative for back pain, joint swelling and neck pain.  Neurological: Negative for dizziness, speech difficulty, light-headedness and numbness.  See HPI. All  other review of systems negative.   Objective:   Today's Vitals: BP (!) 160/90   Pulse 82   Temp 98.4 F (36.9 C) (Temporal)   Ht 5' 3.5" (1.613 m)   Wt 187 lb 3.2 oz (84.9 kg)   SpO2 96%   BMI 32.64 kg/m   Physical Exam  Physical Exam  Constitutional: Oriented to person, place, and time. Appears well-developed and well-nourished.  HENT:  Head: Normocephalic and atraumatic.  Eyes: Conjunctivae and EOM are normal.  Neck: no thyromegaly, neck supple Cardiovascular: Normal rate, regular rhythm, normal heart sounds and intact distal pulses.  No murmur heard. Pulmonary/Chest: Effort normal and breath sounds normal. No stridor. No respiratory distress. Has no wheezes.  Neurological: Is alert and oriented to person, place, and time.  Skin: Skin is warm. Capillary refill takes less than 2 seconds.  Psychiatric: Has a normal mood and affect. Behavior is normal. Judgment and thought content normal.    Lab Results  Component Value Date   TSH 0.402 (L) 05/22/2019   Lab Results  Component Value Date   WBC 8.1 05/22/2019   HGB 13.6 05/22/2019   HCT 39.6 05/22/2019   MCV 83 05/22/2019   PLT 421 05/22/2019    Assessment & Plan:   Problem List Items Addressed This Visit    None    Visit Diagnoses    Postpartum depression    -  Primary Patient with history of postpartum hemorrhage This can temporarily impact the thryoid Will recheck levels in 6 months Continue prozac Follow up with Psychiatry for assessment on mood and attention issues   Relevant Medications   FLUoxetine (PROZAC) 40 MG capsule   Other Relevant Orders   TSH   CBC   Ambulatory referral to Psychiatry   Depot contraception    - continue   Attention deficit    -  Would like assessment of adhd vs. Anxiety vs. depression   Relevant Orders   Ambulatory referral to Psychiatry      Outpatient Encounter Medications as of 05/22/2019  Medication Sig  . FLUoxetine (PROZAC) 40 MG capsule Take 40 mg by mouth daily.   . medroxyPROGESTERone Acetate 150 MG/ML SUSY Inject 1 mL into the muscle every 3 (three) months.  . [DISCONTINUED] ferrous sulfate 325 (65 FE) MG EC tablet Take 1 tablet (325 mg total) by mouth 2 (two) times daily.  . [DISCONTINUED] ibuprofen (ADVIL) 600 MG tablet Take 1 tablet (600 mg total) by mouth every 6 (six) hours.  . [DISCONTINUED] Prenatal Vit-Fe Fumarate-FA (PRENATAL MULTIVITAMIN) TABS tablet Take 1 tablet by mouth daily at 12 noon.   No facility-administered encounter medications on file as of 05/22/2019.    Follow-up: No follow-ups  on file.   Doristine Bosworth, MD

## 2019-05-22 NOTE — Patient Instructions (Addendum)
If you have lab work done today you will be contacted with your lab results within the next 2 weeks.  If you have not heard from [place] then please contact us. The fastest way to get your results is to register for My Chart.   IF you received an x-ray today, you will receive an invoice from [facility]. Please contact [facility] at [phone] with questions or concerns regarding your invoice.   IF you received labwork today, you will receive an invoice from [place]. Please contact [facility] at [phone] with questions or concerns regarding your invoice.   Our billing staff will not be able to assist you with questions regarding bills from these companies.  You will be contacted with the lab results as soon as they are available. The fastest way to get your results is to activate your My Chart account. Instructions are located on the last page of this paperwork. If you have not heard from [place] regarding the results in 2 weeks, please contact this office.      Postpartum Baby Blues The postpartum period begins right after the birth of a baby. During this time, there is often a lot of joy and excitement. It is also a time of many changes in the life of the parents. No matter how many times a mother gives birth, each child brings new challenges to the family, including different ways of relating to one another. It is common to have feelings of excitement along with confusing changes in moods, emotions, and thoughts. You may feel happy one minute and sad or stressed the next. These feelings of sadness usually happen in the period right after you have your baby, and they go away within a week or two. This is called the "baby blues." What are the causes? There is no known cause of baby blues. It is likely caused by a combination of factors. However, changes in hormone levels after childbirth are believed to trigger some of the symptoms. Other factors that can play a role in  these mood changes include:  Lack of sleep.  Stressful life events, such as poverty, caring for a loved one, or death of a loved one.  Genetics. What are the signs or symptoms? Symptoms of this condition include:  Brief changes in mood, such as going from extreme happiness to sadness.  Decreased concentration.  Difficulty sleeping.  Crying spells and tearfulness.  Loss of appetite.  Irritability.  Anxiety. If the symptoms of baby blues last for more than 2 weeks or become more severe, you may have postpartum depression. How is this diagnosed? This condition is diagnosed based on an evaluation of your symptoms. There are no medical or lab tests that lead to a diagnosis, but there are various questionnaires that a health care provider may use to identify women with the baby blues or postpartum depression. How is this treated? Treatment is not needed for this condition. The baby blues usually go away on their own in 1-2 weeks. Social support is often all that is needed. You will be encouraged to get adequate sleep and rest. Follow these instructions at home: Lifestyle      Get as much rest as you can. Take a nap when the baby sleeps.  Exercise regularly as told by your health care provider. Some women find yoga and walking to be helpful.  Eat a balanced and nourishing diet. This includes plenty of fruits and vegetables, whole grains, and lean proteins.  Do little  things that you enjoy. Have a cup of tea, take a bubble bath, read your favorite magazine, or listen to your favorite music.  Avoid alcohol.  Ask for help with household chores, cooking, grocery shopping, or running errands. Do not try to do everything yourself. Consider hiring a postpartum doula to help. This is a professional who specializes in providing support to new mothers.  Try not to make any major life changes during pregnancy or right after giving birth. This can add stress. General instructions  Talk  to people close to you about how you are feeling. Get support from your partner, family members, friends, or other new moms. You may want to join a support group.  Find ways to cope with stress. This may include: ? Writing your thoughts and feelings in a journal. ? Spending time outside. ? Spending time with people who make you laugh.  Try to stay positive in how you think. Think about the things you are grateful for.  Take over-the-counter and prescription medicines only as told by your health care provider.  Let your health care provider know if you have any concerns.  Keep all postpartum visits as told by your health care provider. This is important. Contact a health care provider if:  Your baby blues do not go away after 2 weeks. Get help right away if:  You have thoughts of taking your own life (suicidal thoughts).  You think you may harm the baby or other people.  You see or hear things that are not there (hallucinations). Summary  After giving birth, you may feel happy one minute and sad or stressed the next. Feelings of sadness that happen right after the baby is born and go away after a week or two are called the "baby blues."  You can manage the baby blues by getting enough rest, eating a healthy diet, exercising, spending time with supportive people, and finding ways to cope with stress.  If feelings of sadness and stress last longer than 2 weeks or get in the way of caring for your baby, talk to your health care provider. This may mean you have postpartum depression. This information is not intended to replace advice given to you by your health care provider. Make sure you discuss any questions you have with your health care provider. Document Revised: 07/08/2018 Document Reviewed: 05/12/2016 Elsevier Patient Education  [place].

## 2019-05-23 DIAGNOSIS — Z3040 Encounter for surveillance of contraceptives, unspecified: Secondary | ICD-10-CM | POA: Diagnosis not present

## 2019-05-23 LAB — CBC
Hematocrit: 39.6 % (ref 34.0–46.6)
Hemoglobin: 13.6 g/dL (ref 11.1–15.9)
MCH: 28.6 pg (ref 26.6–33.0)
MCHC: 34.3 g/dL (ref 31.5–35.7)
MCV: 83 fL (ref 79–97)
Platelets: 421 10*3/uL (ref 150–450)
RBC: 4.76 x10E6/uL (ref 3.77–5.28)
RDW: 12.2 % (ref 11.7–15.4)
WBC: 8.1 10*3/uL (ref 3.4–10.8)

## 2019-05-23 LAB — TSH: TSH: 0.402 u[IU]/mL — ABNORMAL LOW (ref 0.450–4.500)

## 2019-06-13 DIAGNOSIS — F331 Major depressive disorder, recurrent, moderate: Secondary | ICD-10-CM | POA: Diagnosis not present

## 2019-06-26 ENCOUNTER — Other Ambulatory Visit (HOSPITAL_BASED_OUTPATIENT_CLINIC_OR_DEPARTMENT_OTHER): Payer: Self-pay | Admitting: Obstetrics & Gynecology

## 2019-07-24 ENCOUNTER — Telehealth: Payer: Self-pay | Admitting: Family Medicine

## 2019-07-24 NOTE — Telephone Encounter (Signed)
Patient is calling to have orders Fasting Lab with [facility] placed at her job [employer] [facility] on [address] Please advise [phone]

## 2019-07-25 NOTE — Telephone Encounter (Signed)
This pt is wanting fasting labs ordered for her PE appt  with you on 08/04/2019 but wants the labs drawn at her office [facility] on [address]  I looked at labs and she needs TSH, COMP, AND LIPID. Are you in agreement with these?  Please advise and I will future them so they can be drawn at her office and resulted before her 08/04/2019 visit.

## 2019-07-25 NOTE — Telephone Encounter (Signed)
Pt requesting labs to be placed for upcoming appt on 08/04/2019.

## 2019-07-31 NOTE — Telephone Encounter (Signed)
Renee Ellis will check with provider and call patient back / awaiting ok from provider

## 2019-08-01 ENCOUNTER — Other Ambulatory Visit: Payer: Self-pay

## 2019-08-01 DIAGNOSIS — Z13228 Encounter for screening for other metabolic disorders: Secondary | ICD-10-CM

## 2019-08-01 DIAGNOSIS — E0590 Thyrotoxicosis, unspecified without thyrotoxic crisis or storm: Secondary | ICD-10-CM | POA: Diagnosis not present

## 2019-08-01 DIAGNOSIS — Z13220 Encounter for screening for lipoid disorders: Secondary | ICD-10-CM | POA: Diagnosis not present

## 2019-08-01 NOTE — Telephone Encounter (Signed)
Spoke with pt and labs orderd and signed so they may be drawn at her office.  Pt called and made aware of this today and will have the drawn today.  Pt appreciative of call back regarding this matter.

## 2019-08-01 NOTE — Telephone Encounter (Signed)
Pt called and is wanting to know if she can do her labs at work or not because she is fasting right now and she eats lunch at 12:30 and is wanting to know if she can eat or not. Pt would like a call before her lunch. [phone] Please advise.

## 2019-08-01 NOTE — Telephone Encounter (Signed)
Spoke with pt advised labs entered and signed for release.  Pt appreciative and will have them drawn today for PE appti with [facility] on 08/04/2019.

## 2019-08-02 LAB — COMPREHENSIVE METABOLIC PANEL
ALT: 16 IU/L (ref 0–32)
AST: 19 IU/L (ref 0–40)
Albumin/Globulin Ratio: 1.7 (ref 1.2–2.2)
Albumin: 4.8 g/dL (ref 3.8–4.8)
Alkaline Phosphatase: 112 IU/L (ref 39–117)
BUN/Creatinine Ratio: 11 (ref 9–23)
BUN: 9 mg/dL (ref 6–20)
Bilirubin Total: 0.5 mg/dL (ref 0.0–1.2)
CO2: 21 mmol/L (ref 20–29)
Calcium: 9.5 mg/dL (ref 8.7–10.2)
Chloride: 103 mmol/L (ref 96–106)
Creatinine, Ser: 0.81 mg/dL (ref 0.57–1.00)
GFR calc Af Amer: 108 mL/min/{1.73_m2} (ref >59)
GFR calc non Af Amer: 94 mL/min/{1.73_m2} (ref >59)
Globulin, Total: 2.9 g/dL (ref 1.5–4.5)
Glucose: 82 mg/dL (ref 65–99)
Potassium: 4.1 mmol/L (ref 3.5–5.2)
Sodium: 141 mmol/L (ref 134–144)
Total Protein: 7.7 g/dL (ref 6.0–8.5)

## 2019-08-02 LAB — LIPID PANEL
Chol/HDL Ratio: 4.2 ratio (ref 0.0–4.4)
Cholesterol, Total: 217 mg/dL — ABNORMAL HIGH (ref 100–199)
HDL: 52 mg/dL (ref >39)
LDL Chol Calc (NIH): 142 mg/dL — ABNORMAL HIGH (ref 0–99)
Triglycerides: 129 mg/dL (ref 0–149)
VLDL Cholesterol Cal: 23 mg/dL (ref 5–40)

## 2019-08-02 LAB — TSH: TSH: 0.627 u[IU]/mL (ref 0.450–4.500)

## 2019-08-04 ENCOUNTER — Ambulatory Visit (INDEPENDENT_AMBULATORY_CARE_PROVIDER_SITE_OTHER): Payer: 59 | Admitting: Family Medicine

## 2019-08-04 ENCOUNTER — Other Ambulatory Visit: Payer: Self-pay

## 2019-08-04 ENCOUNTER — Encounter: Payer: Self-pay | Admitting: Family Medicine

## 2019-08-04 VITALS — BP 146/90 | HR 90 | Temp 98.0°F | Ht 64.0 in | Wt 202.6 lb

## 2019-08-04 DIAGNOSIS — E0590 Thyrotoxicosis, unspecified without thyrotoxic crisis or storm: Secondary | ICD-10-CM | POA: Diagnosis not present

## 2019-08-04 DIAGNOSIS — Z13228 Encounter for screening for other metabolic disorders: Secondary | ICD-10-CM | POA: Diagnosis not present

## 2019-08-04 DIAGNOSIS — Z13220 Encounter for screening for lipoid disorders: Secondary | ICD-10-CM | POA: Diagnosis not present

## 2019-08-04 DIAGNOSIS — Z0001 Encounter for general adult medical examination with abnormal findings: Secondary | ICD-10-CM | POA: Diagnosis not present

## 2019-08-04 DIAGNOSIS — Z0000 Encounter for general adult medical examination without abnormal findings: Secondary | ICD-10-CM

## 2019-08-04 NOTE — Patient Instructions (Addendum)
Weighted Exercise Hula Hoop 10 minutes of exercise a day Metformin with breakfast  For weight loss    If you have lab work done today you will be contacted with your lab results within the next 2 weeks.  If you have not heard from [place] then please contact us. The fastest way to get your results is to register for My Chart.   IF you received an x-ray today, you will receive an invoice from [facility]. Please contact [facility] at [phone] with questions or concerns regarding your invoice.   IF you received labwork today, you will receive an invoice from [place]. Please contact [facility] at [phone] with questions or concerns regarding your invoice.   Our billing staff will not be able to assist you with questions regarding bills from these companies.  You will be contacted with the lab results as soon as they are available. The fastest way to get your results is to activate your My Chart account. Instructions are located on the last page of this paperwork. If you have not heard from [place] regarding the results in 2 weeks, please contact this office.

## 2019-08-04 NOTE — Progress Notes (Signed)
Chief Complaint  Patient presents with  . Annual Exam    CPE w/o PAP     Subjective:  Renee Ellis is a 39 y.o. female here for a health maintenance visit.  Patient is established pt  Patient Active Problem List   Diagnosis Date Noted  . Normal postpartum course 07/29/2018  . Postpartum anemia 07/29/2018  . SVD (spontaneous vaginal delivery) 07/28/2018  . Indication for care in labor or delivery 07/27/2018    Past Medical History:  Diagnosis Date  . Hypertension     Past Surgical History:  Procedure Laterality Date  . LIPOMA EXCISION       Outpatient Medications Prior to Visit  Medication Sig Dispense Refill  . AUROVELA FE 1/20 1-20 MG-MCG tablet Take 1 tablet by mouth daily.    Marland Kitchen FLUoxetine (PROZAC) 40 MG capsule Take 40 mg by mouth daily.    . medroxyPROGESTERone Acetate 150 MG/ML SUSY Inject 1 mL into the muscle every 3 (three) months.     No facility-administered medications prior to visit.    No Known Allergies   History reviewed. No pertinent family history.   Health Habits: Dental Exam: up to date Eye Exam: up to date Exercise:  times/week on average Current exercise activities: walking Diet:   Social History   Socioeconomic History  . Marital status: Single    Spouse name: Not on file  . Number of children: Not on file  . Years of education: Not on file  . Highest education level: Not on file  Occupational History  . Not on file  Tobacco Use  . Smoking status: Never Smoker  . Smokeless tobacco: Never Used  Substance and Sexual Activity  . Alcohol use: Not Currently    Comment: prior to pregnancy  . Drug use: Never  . Sexual activity: Yes  Other Topics Concern  . Not on file  Social History Narrative  . Not on file   Social Determinants of Health   Financial Resource Strain:   . Difficulty of Paying Living Expenses:   Food Insecurity:   . Worried About Charity fundraiser in the Last Year:   . Arboriculturist in the Last Year:    Transportation Needs:   . Film/video editor (Medical):   Marland Kitchen Lack of Transportation (Non-Medical):   Physical Activity:   . Days of Exercise per Week:   . Minutes of Exercise per Session:   Stress:   . Feeling of Stress :   Social Connections:   . Frequency of Communication with Friends and Family:   . Frequency of Social Gatherings with Friends and Family:   . Attends Religious Services:   . Active Member of Clubs or Organizations:   . Attends Archivist Meetings:   Marland Kitchen Marital Status:   Intimate Partner Violence:   . Fear of Current or Ex-Partner:   . Emotionally Abused:   Marland Kitchen Physically Abused:   . Sexually Abused:    Social History   Substance and Sexual Activity  Alcohol Use Not Currently   Comment: prior to pregnancy   Social History   Tobacco Use  Smoking Status Never Smoker  Smokeless Tobacco Never Used   Social History   Substance and Sexual Activity  Drug Use Never    GYN: Sexual Health Menstrual status: regular menses LMP: Patient's last menstrual period was 07/29/2019. Last pap smear: see HM section History of abnormal pap smears:    Health Maintenance: See under health Maintenance activity  for review of completion dates as well. Immunization History  Administered Date(s) Administered  . Influenza-Unspecified 01/24/2019      Depression Screen-PHQ2/9 Depression screen [facility] 2/9 08/04/2019 05/22/2019  Decreased Interest 0 0  Down, Depressed, Hopeless 0 0  PHQ - 2 Score 0 0       Depression Severity and Treatment Recommendations:  0-4= None  5-9= Mild / Treatment: Support, educate to call if worse; return in one month  10-14= Moderate / Treatment: Support, watchful waiting; Antidepressant or Psycotherapy  15-19= Moderately severe / Treatment: Antidepressant OR Psychotherapy  >= 20 = Major depression, severe / Antidepressant AND Psychotherapy    Review of Systems   ROS  See HPI for ROS as well.   Review of Systems   Constitutional: Negative for activity change, appetite change, chills and fever.  HENT: Negative for congestion, nosebleeds, trouble swallowing and voice change.   Respiratory: Negative for cough, shortness of breath and wheezing.   Gastrointestinal: Negative for diarrhea, nausea and vomiting.  Genitourinary: Negative for difficulty urinating, dysuria, flank pain and hematuria.  Musculoskeletal: Negative for back pain, joint swelling and neck pain.  Neurological: Negative for dizziness, speech difficulty, light-headedness and numbness.  See HPI. All other review of systems negative.    Objective:   Vitals:   08/04/19 0844  BP: (!) 146/90  Pulse: 90  Temp: 98 F (36.7 C)  TempSrc: Temporal  SpO2: 96%  Weight: 202 lb 9.6 oz (91.9 kg)  Height: 5\' 4"  (1.626 m)   Wt Readings from Last 3 Encounters:  08/04/19 202 lb 9.6 oz (91.9 kg)  05/22/19 187 lb 3.2 oz (84.9 kg)  07/27/18 196 lb (88.9 kg)    Body mass index is 34.78 kg/m.  Physical Exam  Physical Exam  Constitutional: Oriented to person, place, and time. Appears well-developed and well-nourished.  HENT:  Head: Normocephalic and atraumatic.  Eyes: Conjunctivae and EOM are normal.  Cardiovascular: Normal rate, regular rhythm, normal heart sounds and intact distal pulses.  No murmur heard. Pulmonary/Chest: Effort normal and breath sounds normal. No stridor. No respiratory distress. Has no wheezes.  Neurological: Is alert and oriented to person, place, and time.  Skin: Skin is warm. Capillary refill takes less than 2 seconds.  Psychiatric: Has a normal mood and affect. Behavior is normal. Judgment and thought content normal.     Assessment/Plan:   Patient was seen for a health maintenance exam.  Counseled the patient on health maintenance issues. Reviewed her health mainteance schedule and ordered appropriate tests (see orders.) Counseled on regular exercise and weight management. Recommend regular eye exams and dental  cleaning.   The following issues were addressed today for health maintenance:   Deion was seen today for annual exam.  Diagnoses and all orders for this visit:  Encounter for health maintenance examination in adult - Women's Health Maintenance Plan Advised monthly breast exam and annual mammogram Advised dental exam every six months Discussed stress management Discussed pap smear screening guidelines   Hyperthyroidism -     TSH; Future  Screening for cholesterol level -     Lipid panel; Future  Encounter for screening for metabolic disorder -     Comprehensive metabolic panel; Future    Return in about 2 months (around 10/04/2019) for weight check with [facility] .    Body mass index is 34.78 kg/m.:  Discussed the patient's BMI with patient. The BMI body mass index is 34.78 kg/m.     No future appointments.  Patient  Instructions   Weighted Exercise Hula Hoop 10 minutes of exercise a day Metformin with breakfast  For weight loss    If you have lab work done today you will be contacted with your lab results within the next 2 weeks.  If you have not heard from [place] then please contact us. The fastest way to get your results is to register for My Chart.   IF you received an x-ray today, you will receive an invoice from [facility]. Please contact [facility] at [phone] with questions or concerns regarding your invoice.   IF you received labwork today, you will receive an invoice from [place]. Please contact [facility] at [phone] with questions or concerns regarding your invoice.   Our billing staff will not be able to assist you with questions regarding bills from these companies.  You will be contacted with the lab results as soon as they are available. The fastest way to get your results is to activate your My Chart account. Instructions are located on the last page of this paperwork. If you have not heard from [place] regarding the  results in 2 weeks, please contact this office.

## 2019-08-18 DIAGNOSIS — F329 Major depressive disorder, single episode, unspecified: Secondary | ICD-10-CM | POA: Diagnosis not present

## 2019-09-08 DIAGNOSIS — F331 Major depressive disorder, recurrent, moderate: Secondary | ICD-10-CM | POA: Diagnosis not present

## 2019-10-24 DIAGNOSIS — F320 Major depressive disorder, single episode, mild: Secondary | ICD-10-CM | POA: Diagnosis not present

## 2019-11-22 ENCOUNTER — Telehealth: Payer: 59 | Admitting: Nurse Practitioner

## 2019-11-22 DIAGNOSIS — H00011 Hordeolum externum right upper eyelid: Secondary | ICD-10-CM | POA: Diagnosis not present

## 2019-11-22 MED ORDER — NEOMYCIN-BACITRACIN ZN-POLYMYX 5-400-10000 OP OINT: 1.0000 "application " | TOPICAL_OINTMENT | Freq: Four times a day (QID) | OPHTHALMIC | 0 refills | Status: DC

## 2019-11-22 NOTE — Progress Notes (Signed)
We are sorry that you are not feeling well. Here is how we plan to help!  Based on what you have shared with me it looks like you have a stye.  A stye is an inflammation of the eyelid.  It is often a red, painful lump near the edge of the eyelid that may look like a boil or a pimple.  A stye develops when an infection occurs at the base of an eyelash.   We have made appropriate suggestions for you based upon your presentation: Simple styes can be treated without medical intervention.  Most styes either resolve spontaneously or resolve with simple home treatment by applying warm compresses or heated washcloth to the stye for about 10-15 minutes three to four times a day. This causes the stye to drain and resolve.  HOME CARE:   Wash your hands often!  Let the stye open on its own. Don't squeeze or open it.  Don't rub your eyes. This can irritate your eyes and let in bacteria.  If you need to touch your eyes, wash your hands first.  Don't wear eye makeup or contact lenses until the area has healed.  GET HELP RIGHT AWAY IF:   Your symptoms do not improve.  You develop blurred or loss of vision.  Your symptoms worsen (increased discharge, pain or redness).  Thank you for choosing an e-visit.  Your e-visit answers were reviewed by a board certified advanced clinical practitioner to complete your personal care plan.  Depending upon the condition, your plan could have included both over the counter or prescription medications.  Please review your pharmacy choice.  Make sure the pharmacy is open so you can pick up prescription now.  If there is a problem, you may contact your provider through MyChart messaging and have the prescription routed to another pharmacy.    Your safety is important to us.  If you have drug allergies check your prescription carefully.  For the next 24 hours you can use MyChart to ask questions about today's visit, request a non-urgent call back, or ask for a work or  school excuse.  You will get an email in the next two days asking about your experience.  I hope you that your e-visit has been valuable and will speed your recovery.   5-10 minutes spent reviewing and documenting in chart.  

## 2019-11-22 NOTE — Addendum Note (Signed)
Addended by: Jannifer Rodney A on: 11/22/2019 05:02 PM   Modules accepted: Orders

## 2019-11-23 MED ORDER — ERYTHROMYCIN 5 MG/GM OP OINT
TOPICAL_OINTMENT | OPHTHALMIC | 0 refills | Status: DC
Start: 2019-11-23 — End: 2020-06-27

## 2019-11-23 NOTE — Addendum Note (Signed)
Addended by: Arthor Captain on: 11/23/2019 11:40 AM   Modules accepted: Orders

## 2019-11-30 DIAGNOSIS — F900 Attention-deficit hyperactivity disorder, predominantly inattentive type: Secondary | ICD-10-CM | POA: Diagnosis not present

## 2019-12-05 ENCOUNTER — Ambulatory Visit: Payer: 59

## 2019-12-05 DIAGNOSIS — Z23 Encounter for immunization: Secondary | ICD-10-CM

## 2019-12-14 ENCOUNTER — Ambulatory Visit: Payer: Self-pay

## 2019-12-14 ENCOUNTER — Ambulatory Visit (INDEPENDENT_AMBULATORY_CARE_PROVIDER_SITE_OTHER): Payer: 59 | Admitting: Surgery

## 2019-12-14 ENCOUNTER — Encounter: Payer: Self-pay | Admitting: Surgery

## 2019-12-14 VITALS — BP 156/92 | HR 102 | Ht 64.0 in | Wt 202.6 lb

## 2019-12-14 DIAGNOSIS — M79672 Pain in left foot: Secondary | ICD-10-CM | POA: Diagnosis not present

## 2019-12-14 DIAGNOSIS — S9030XA Contusion of unspecified foot, initial encounter: Secondary | ICD-10-CM | POA: Diagnosis not present

## 2019-12-21 ENCOUNTER — Ambulatory Visit (INDEPENDENT_AMBULATORY_CARE_PROVIDER_SITE_OTHER): Payer: 59 | Admitting: Surgery

## 2019-12-21 ENCOUNTER — Encounter: Payer: Self-pay | Admitting: Surgery

## 2019-12-21 VITALS — BP 145/95 | HR 104

## 2019-12-21 DIAGNOSIS — M79672 Pain in left foot: Secondary | ICD-10-CM

## 2019-12-21 DIAGNOSIS — S9030XA Contusion of unspecified foot, initial encounter: Secondary | ICD-10-CM | POA: Diagnosis not present

## 2019-12-21 NOTE — Progress Notes (Signed)
39 year old black female returns for recheck of her left foot.  States that swelling has decreased.  She has been compliant with wearing her boot.  Does admit to not being overly compliant with foot elevation as we discussed.  Exam Very pleasant female alert and oriented in no acute distress.  Left foot swelling decreased from previous visit.  She does continue to have trace bruising.  Mid dorsal foot still tender.  Calf is nontender.  Neurovascular intact.  Plan Patient will continue the cam boot.  Again stressed to her other importance of elevating her foot to help decrease swelling.  Recommend that she also use ibuprofen 800 mg p.o. twice daily or 3 times daily as needed with food.  Follow-up with me in 2 weeks for recheck.  May consider putting her into a postop shoe at that time depending upon how she is doing.

## 2019-12-22 ENCOUNTER — Encounter: Payer: Self-pay | Admitting: Surgery

## 2019-12-22 NOTE — Progress Notes (Signed)
Office Visit Note   Patient: Renee Ellis           Date of Birth: [birth year]           MRN: [id number] Visit Date: 12/14/2019              Requested by: Doristine Bosworth, MD [phone] [address] [place] [address] [place],  [place] [address] PCP: Doristine Bosworth, MD   Assessment & Plan: Visit Diagnoses:  1. Left foot pain   2. Contusion of dorsum of foot     Plan: Today patient was put in a cam boot.  Advised that I do not see an obvious fracture of her foot.  Also did show the x-rays to Dr. Aldean Baker.  She may have a midfoot ligamentous injury along with her contusion.  She will follow-up with me in 1 week for recheck.  I did advise patient that I want her to elevate her foot is much as possible to help decrease the swelling that she has.  May use over-the-counter NSAID.  Ice off and on as needed.  Follow-Up Instructions: Return in about 1 week (around 12/21/2019) for with [facility] recheck foot.   Orders:  Orders Placed This Encounter  Procedures  . XR Foot Complete Left   No orders of the defined types were placed in this encounter.     Procedures: No procedures performed   Clinical Data: No additional findings.   Subjective: Chief Complaint  Patient presents with  . Left Foot - Injury    HPI 39 year old black female comes in today with complaints of left foot pain secondary to injury that occurred August 30.  Patient states that a kitchen chair fell on the top of her foot.  Had immediate pain and swelling and bruising after this.  Discomfort with weightbearing.  She tried to give this some time before getting seen. Review of Systems No current cardiac pulmonary GI GU issues  Objective: Vital Signs: BP (!) 156/92   Pulse (!) 102   Ht 5\' 4"  (1.626 m)   Wt 202 lb 9.6 oz (91.9 kg)   BMI 34.78 kg/m   Physical Exam Constitutional:      Appearance: Normal appearance.  HENT:     Head: Normocephalic and atraumatic.  Eyes:     Extraocular Movements: Extraocular  movements intact.     Pupils: Pupils are equal, round, and reactive to light.  Pulmonary:     Effort: No respiratory distress.  Musculoskeletal:     Comments: Gait is antalgic.  Exam left foot she does have moderate swelling and some bruising.  Midfoot is exquisitely tender to palpation.  Difficult to feel her pedal pulses due to the swelling.  She is neurologically intact.  Calf is nontender.  Ankle is unremarkable.  Neurological:     General: No focal deficit present.     Mental Status: She is alert and oriented to person, place, and time.  Psychiatric:        Mood and Affect: Mood normal.     Ortho Exam  Specialty Comments:  No specialty comments available.  Imaging: No results found.   PMFS History: Patient Active Problem List   Diagnosis Date Noted  . Normal postpartum course 07/29/2018  . Postpartum anemia 07/29/2018  . SVD (spontaneous vaginal delivery) 07/28/2018  . Indication for care in labor or delivery 07/27/2018   Past Medical History:  Diagnosis Date  . Hypertension     History reviewed. No  pertinent family history.  Past Surgical History:  Procedure Laterality Date  . LIPOMA EXCISION     Social History   Occupational History  . Not on file  Tobacco Use  . Smoking status: Never Smoker  . Smokeless tobacco: Never Used  Vaping Use  . Vaping Use: Never used  Substance and Sexual Activity  . Alcohol use: Not Currently    Comment: prior to pregnancy  . Drug use: Never  . Sexual activity: Yes

## 2020-01-08 ENCOUNTER — Ambulatory Visit: Payer: 59

## 2020-01-08 DIAGNOSIS — Z23 Encounter for immunization: Secondary | ICD-10-CM

## 2020-01-10 ENCOUNTER — Encounter: Payer: Self-pay | Admitting: Surgery

## 2020-01-10 ENCOUNTER — Ambulatory Visit (INDEPENDENT_AMBULATORY_CARE_PROVIDER_SITE_OTHER): Payer: 59 | Admitting: Surgery

## 2020-01-10 VITALS — BP 144/97 | HR 85

## 2020-01-10 DIAGNOSIS — M79672 Pain in left foot: Secondary | ICD-10-CM

## 2020-01-10 DIAGNOSIS — S9030XA Contusion of unspecified foot, initial encounter: Secondary | ICD-10-CM | POA: Diagnosis not present

## 2020-01-10 DIAGNOSIS — S9032XA Contusion of left foot, initial encounter: Secondary | ICD-10-CM | POA: Diagnosis not present

## 2020-01-10 NOTE — Progress Notes (Signed)
   Office Visit Note   Patient: Renee Ellis           Date of Birth: 05-30-1980           MRN: [id number] Visit Date: 01/10/2020              Requested by: Doristine Bosworth, MD [phone] [address] [place],  [place] [address] PCP: Doristine Bosworth, MD   Assessment & Plan: Visit Diagnoses:  1. Contusion of dorsum of foot   2. Left foot pain     Plan: Patient doing much better.  She was put in a postop shoe and she will wean out of this and transition to a regular shoe over the next couple weeks.  Follow-up with me in 2 weeks for recheck.  If she is doing well and not having any issues at that time she may cancel that appointment.  If she continues have ongoing discomfort she will return and I will plan to repeat left foot x-ray just to make sure that she is not having stress fracture that was missed on previous films.  Patient agrees with this plan.  All questions answered.  Follow-Up Instructions: Return in about 2 weeks (around 01/24/2020) for with Jerret Mcbane.   Orders:  No orders of the defined types were placed in this encounter.  No orders of the defined types were placed in this encounter.     Procedures: No procedures performed   Clinical Data: No additional findings.   Subjective: Chief Complaint  Patient presents with  . Left Foot - Follow-up    HPI 39 year old female returns for recheck of her left midfoot contusion.  She has been compliant with wearing her boot.  States that her foot is definitely feeling better and swelling has decreased.  Objective: Vital Signs: BP (!) 144/97   Pulse 85   Physical Exam Very pleasant female alert and oriented in no acute distress.  Left foot swelling is greatly improved.  Pretty much looks normal compared to the right.  Minimally tender across the midfoot.  This is also greatly improved from previous visit. Ortho Exam  Specialty Comments:  No specialty comments available.  Imaging: No results  found.   PMFS History: Patient Active Problem List   Diagnosis Date Noted  . Normal postpartum course 07/29/2018  . Postpartum anemia 07/29/2018  . SVD (spontaneous vaginal delivery) 07/28/2018  . Indication for care in labor or delivery 07/27/2018   Past Medical History:  Diagnosis Date  . Hypertension     History reviewed. No pertinent family history.  Past Surgical History:  Procedure Laterality Date  . LIPOMA EXCISION     Social History   Occupational History  . Not on file  Tobacco Use  . Smoking status: Never Smoker  . Smokeless tobacco: Never Used  Vaping Use  . Vaping Use: Never used  Substance and Sexual Activity  . Alcohol use: Not Currently    Comment: prior to pregnancy  . Drug use: Never  . Sexual activity: Yes

## 2020-01-18 DIAGNOSIS — F900 Attention-deficit hyperactivity disorder, predominantly inattentive type: Secondary | ICD-10-CM | POA: Diagnosis not present

## 2020-01-24 ENCOUNTER — Ambulatory Visit: Payer: 59 | Admitting: Surgery

## 2020-02-12 ENCOUNTER — Other Ambulatory Visit (HOSPITAL_BASED_OUTPATIENT_CLINIC_OR_DEPARTMENT_OTHER): Payer: Self-pay | Admitting: Obstetrics & Gynecology

## 2020-02-28 DIAGNOSIS — F900 Attention-deficit hyperactivity disorder, predominantly inattentive type: Secondary | ICD-10-CM | POA: Diagnosis not present

## 2020-03-11 DIAGNOSIS — N898 Other specified noninflammatory disorders of vagina: Secondary | ICD-10-CM | POA: Diagnosis not present

## 2020-03-11 DIAGNOSIS — Z124 Encounter for screening for malignant neoplasm of cervix: Secondary | ICD-10-CM | POA: Diagnosis not present

## 2020-03-11 DIAGNOSIS — Z01419 Encounter for gynecological examination (general) (routine) without abnormal findings: Secondary | ICD-10-CM | POA: Diagnosis not present

## 2020-03-18 MED FILL — SERTRALINE HCL 50 MG TABLET: 50 | 30 days supply | Qty: 30 | Fill #0

## 2020-04-08 MED FILL — BLISOVI FE 1/20 1-20 MG-MCG: 1-20 | 84 days supply | Qty: 84 | Fill #0

## 2020-04-17 ENCOUNTER — Other Ambulatory Visit (HOSPITAL_BASED_OUTPATIENT_CLINIC_OR_DEPARTMENT_OTHER): Payer: Self-pay | Admitting: Family Medicine

## 2020-04-17 DIAGNOSIS — Z0000 Encounter for general adult medical examination without abnormal findings: Secondary | ICD-10-CM | POA: Diagnosis not present

## 2020-04-17 DIAGNOSIS — R7309 Other abnormal glucose: Secondary | ICD-10-CM | POA: Diagnosis not present

## 2020-04-17 DIAGNOSIS — F418 Other specified anxiety disorders: Secondary | ICD-10-CM | POA: Diagnosis not present

## 2020-04-17 DIAGNOSIS — R7989 Other specified abnormal findings of blood chemistry: Secondary | ICD-10-CM | POA: Diagnosis not present

## 2020-04-17 DIAGNOSIS — F909 Attention-deficit hyperactivity disorder, unspecified type: Secondary | ICD-10-CM | POA: Diagnosis not present

## 2020-04-17 MED FILL — SERTRALINE HCL 100 MG TABS: 100 | 90 days supply | Qty: 90 | Fill #0

## 2020-06-06 DIAGNOSIS — N898 Other specified noninflammatory disorders of vagina: Secondary | ICD-10-CM | POA: Diagnosis not present

## 2020-06-27 ENCOUNTER — Other Ambulatory Visit: Payer: Self-pay | Admitting: Physician Assistant

## 2020-06-27 ENCOUNTER — Ambulatory Visit (INDEPENDENT_AMBULATORY_CARE_PROVIDER_SITE_OTHER): Payer: 59

## 2020-06-27 ENCOUNTER — Encounter: Payer: Self-pay | Admitting: Orthopaedic Surgery

## 2020-06-27 ENCOUNTER — Ambulatory Visit (INDEPENDENT_AMBULATORY_CARE_PROVIDER_SITE_OTHER): Payer: 59 | Admitting: Orthopaedic Surgery

## 2020-06-27 VITALS — Ht 64.0 in | Wt 195.0 lb

## 2020-06-27 DIAGNOSIS — M79645 Pain in left finger(s): Secondary | ICD-10-CM | POA: Diagnosis not present

## 2020-06-27 MED ORDER — DICLOFENAC SODIUM 75 MG PO TBEC: 75.0000 mg | DELAYED_RELEASE_TABLET | Freq: Two times a day (BID) | ORAL | 2 refills | Status: DC | PRN

## 2020-06-27 MED ORDER — PREDNISONE 10 MG (21) PO TBPK
ORAL_TABLET | ORAL | 0 refills | Status: DC
Start: 2020-06-27 — End: 2020-07-11

## 2020-06-27 MED FILL — predniSONE 10 MG TABS: 10 | 6 days supply | Qty: 21 | Fill #0

## 2020-06-27 NOTE — Progress Notes (Signed)
Office Visit Note   Patient: Renee Ellis           Date of Birth: [birth year]           MRN: [id number] Visit Date: 06/27/2020              Requested by: Doristine Bosworth, MD [phone] [address] [place],  [place] [address] PCP: Doristine Bosworth, MD   Assessment & Plan: Visit Diagnoses:  1. Pain of left thumb     Plan: Impression is chronic left thumb pain.  We have discussed starting a course of steroids followed by 2 weeks of anti-inflammatories which she is agreeable with.  She will follow up with [place] as needed.  Follow-Up Instructions: Return if symptoms worsen or fail to improve.   Orders:  Orders Placed This Encounter  Procedures  . XR Finger Thumb Left   Meds ordered this encounter  Medications  . predniSONE (STERAPRED UNI-PAK 21 TAB) 10 MG (21) TBPK tablet    Sig: Take as directed    Dispense:  21 tablet    Refill:  0  . diclofenac (VOLTAREN) 75 MG EC tablet    Sig: Take 1 tablet (75 mg total) by mouth 2 (two) times daily as needed.    Dispense:  60 tablet    Refill:  2      Procedures: No procedures performed   Clinical Data: No additional findings.   Subjective: Chief Complaint  Patient presents with  . Left Hand - Pain    HPI patient is a pleasant 40 year old right-hand-dominant female who comes in today with left thumb pain for the past few weeks.  No known injury or change in activity.  Majority of her pain is to the thenar eminence.  Opening things as well as pushing down with her hand seems to aggravate her symptoms most.  She has tried Tylenol and an occasional ibuprofen without significant relief.  No numbness, tingling or burning to the hand or fingers.  Review of Systems as detailed in HPI.  All others reviewed and are negative.   Objective: Vital Signs: Ht 5\' 4"  (1.626 m)   Wt 195 lb (88.5 kg)   BMI 33.47 kg/m   Physical Exam well-developed well-nourished female no acute distress.  Alert oriented x3.  Ortho Exam left  elbow exam shows no swelling.  Mild to moderate tenderness to the base of the thumb.  Negative grind test.  She has slight increased pain with valgus stress.  Full range of motion.  She is neurovascular intact distally.  Specialty Comments:  No specialty comments available.  Imaging: XR Finger Thumb Left  Result Date: 06/27/2020 No acute or structural abnormalities    PMFS History: Patient Active Problem List   Diagnosis Date Noted  . Normal postpartum course 07/29/2018  . Postpartum anemia 07/29/2018  . SVD (spontaneous vaginal delivery) 07/28/2018  . Indication for care in labor or delivery 07/27/2018   Past Medical History:  Diagnosis Date  . Hypertension     History reviewed. No pertinent family history.  Past Surgical History:  Procedure Laterality Date  . LIPOMA EXCISION     Social History   Occupational History  . Not on file  Tobacco Use  . Smoking status: Never Smoker  . Smokeless tobacco: Never Used  Vaping Use  . Vaping Use: Never used  Substance and Sexual Activity  . Alcohol use: Not Currently    Comment: prior to pregnancy  .  Drug use: Never  . Sexual activity: Yes

## 2020-06-28 MED FILL — DICLOFENAC SODIUM 75 MG TAB: 75 | 30 days supply | Qty: 60 | Fill #0

## 2020-06-29 ENCOUNTER — Other Ambulatory Visit (HOSPITAL_BASED_OUTPATIENT_CLINIC_OR_DEPARTMENT_OTHER): Payer: Self-pay

## 2020-06-29 ENCOUNTER — Other Ambulatory Visit: Payer: Self-pay | Admitting: Obstetrics & Gynecology

## 2020-07-01 ENCOUNTER — Other Ambulatory Visit (HOSPITAL_BASED_OUTPATIENT_CLINIC_OR_DEPARTMENT_OTHER): Payer: Self-pay

## 2020-07-01 MED ORDER — NORETHIN ACE-ETH ESTRAD-FE 1-20 MG-MCG PO TABS
1.0000 | ORAL_TABLET | Freq: Every day | ORAL | 0 refills | Status: DC
  Filled 2020-07-01: qty 84, 84d supply, fill #0

## 2020-07-11 ENCOUNTER — Other Ambulatory Visit: Payer: Self-pay

## 2020-07-11 ENCOUNTER — Ambulatory Visit: Payer: 59 | Admitting: Medical

## 2020-07-11 ENCOUNTER — Other Ambulatory Visit (HOSPITAL_BASED_OUTPATIENT_CLINIC_OR_DEPARTMENT_OTHER): Payer: Self-pay

## 2020-07-11 VITALS — BP 182/100 | HR 112 | Temp 98.9°F | Ht 64.0 in | Wt 195.6 lb

## 2020-07-11 DIAGNOSIS — R519 Headache, unspecified: Secondary | ICD-10-CM | POA: Diagnosis not present

## 2020-07-11 DIAGNOSIS — H938X3 Other specified disorders of ear, bilateral: Secondary | ICD-10-CM | POA: Diagnosis not present

## 2020-07-11 DIAGNOSIS — J301 Allergic rhinitis due to pollen: Secondary | ICD-10-CM | POA: Diagnosis not present

## 2020-07-11 DIAGNOSIS — R030 Elevated blood-pressure reading, without diagnosis of hypertension: Secondary | ICD-10-CM | POA: Insufficient documentation

## 2020-07-11 DIAGNOSIS — J3489 Other specified disorders of nose and nasal sinuses: Secondary | ICD-10-CM | POA: Diagnosis not present

## 2020-07-11 MED ORDER — AMOXICILLIN 875 MG PO TABS
875.0000 mg | ORAL_TABLET | Freq: Two times a day (BID) | ORAL | 0 refills | Status: DC
  Filled 2020-07-11: qty 20, 10d supply, fill #0

## 2020-07-11 NOTE — Progress Notes (Signed)
Subjective:  Renee Ellis is a 40 y.o. female who presents for Chief Complaint  Patient presents with  . Ear Pain     Has had sneezing, sinus pressure, ear discomfort.  No sore throat, no fever, no NVD.  No cough.  She typically gets spring allergies with symptoms of runny nose, sneezing.  No teeth ache.  Both of her younger children have similar symptoms, but they have had a cough.  They went to the doctor and was advised they had likely URI.   Has been using some Norel AD.  No other aggravating or relieving factors.    Elevated BP - has hx/o hypertension in prior pregnancy.  Both parents have HTN and snore bad.  She notes no OSA in family.  She snores some. She does get headaches.    No other c/o.  The following portions of the patient's history were reviewed and updated as appropriate: allergies, current medications, past family history, past medical history, past social history, past surgical history and problem list.  ROS Otherwise as in subjective above  Objective: BP (!) 182/100   Pulse (!) 112   Temp 98.9 F (37.2 C)   Ht 5\' 4"  (1.626 m)   Wt 195 lb 9.6 oz (88.7 kg)   SpO2 97%   BMI 33.57 kg/m    BP Readings from Last 3 Encounters:  07/11/20 (!) 182/100  01/10/20 (!) 144/97  12/21/19 (!) 145/95    General appearance: alert, no distress, well developed, well nourished, African American female HEENT: normocephalic, sclerae anicteric, conjunctiva pink and moist, TMs pearly, nares patent, no discharge, + erythema, pharynx normal Oral cavity: MMM, no lesions Neck: supple, no lymphadenopathy, no thyromegaly, no masses Heart: RRR, normal S1, S2, no murmurs Lungs: CTA bilaterally, no wheezes, rhonchi, or rales Pulses: 2+ radial pulses, 2+ pedal pulses, normal cap refill Ext: no edema   Assessment: Encounter Diagnoses  Name Primary?  . Allergic rhinitis due to pollen, unspecified seasonality Yes  . Pressure sensation in both ears   . Elevated blood-pressure reading  without diagnosis of hypertension   . Sinus pressure      Plan: Begin antihistamine.  Stop Norel AD due to elevated BPs hydrate well, use nasal saline flush as discussed If worse sinus pressure, facial pain, thick darker mucous over next week then begin Amoxicillin.  otherwise c/t allergy regimen  Elevated BPs - discussed danger of elevated BP.  She works here at out office, and will have BP monitor 3 times per week for the next 2 weeks.   If numbers persistently elevated, she will f/u with PCP to discuss possible treatment for hypertension, possible sleep study, eval on headaches   Dagny was seen today for ear pain.  Diagnoses and all orders for this visit:  Allergic rhinitis due to pollen, unspecified seasonality  Pressure sensation in both ears  Elevated blood-pressure reading without diagnosis of hypertension  Sinus pressure  Other orders -     amoxicillin (AMOXIL) 875 MG tablet; Take 1 tablet (875 mg total) by mouth 2 (two) times daily.    Follow up: next week with nurse for BP readings, f/u with PCP

## 2020-07-25 ENCOUNTER — Other Ambulatory Visit (HOSPITAL_BASED_OUTPATIENT_CLINIC_OR_DEPARTMENT_OTHER): Payer: Self-pay

## 2020-07-25 MED FILL — Sertraline HCl Tab 100 MG: ORAL | 90 days supply | Qty: 90 | Fill #0 | Status: AC

## 2020-08-20 ENCOUNTER — Other Ambulatory Visit: Payer: Self-pay

## 2020-08-20 ENCOUNTER — Other Ambulatory Visit: Payer: Self-pay | Admitting: Family Medicine

## 2020-08-20 ENCOUNTER — Ambulatory Visit
Admission: RE | Admit: 2020-08-20 | Discharge: 2020-08-20 | Disposition: A | Discharge status: Home / Self Care | Payer: 59 | Source: Ambulatory Visit | Attending: Family Medicine | Admitting: Family Medicine

## 2020-08-20 DIAGNOSIS — Z1231 Encounter for screening mammogram for malignant neoplasm of breast: Secondary | ICD-10-CM | POA: Diagnosis not present

## 2020-08-20 DIAGNOSIS — Z139 Encounter for screening, unspecified: Secondary | ICD-10-CM

## 2020-09-17 DIAGNOSIS — R7989 Other specified abnormal findings of blood chemistry: Secondary | ICD-10-CM | POA: Diagnosis not present

## 2020-09-17 DIAGNOSIS — Z0000 Encounter for general adult medical examination without abnormal findings: Secondary | ICD-10-CM | POA: Diagnosis not present

## 2020-09-17 DIAGNOSIS — R7309 Other abnormal glucose: Secondary | ICD-10-CM | POA: Diagnosis not present

## 2020-09-20 ENCOUNTER — Other Ambulatory Visit (HOSPITAL_BASED_OUTPATIENT_CLINIC_OR_DEPARTMENT_OTHER): Payer: Self-pay

## 2020-09-20 MED ORDER — NORETHIN ACE-ETH ESTRAD-FE 1-20 MG-MCG PO TABS
1.0000 | ORAL_TABLET | Freq: Every day | ORAL | 0 refills | Status: DC
  Filled 2020-09-20: qty 84, 84d supply, fill #0

## 2020-09-23 ENCOUNTER — Other Ambulatory Visit: Payer: Self-pay

## 2020-09-26 ENCOUNTER — Other Ambulatory Visit (HOSPITAL_BASED_OUTPATIENT_CLINIC_OR_DEPARTMENT_OTHER): Payer: Self-pay

## 2020-09-26 DIAGNOSIS — I10 Essential (primary) hypertension: Secondary | ICD-10-CM | POA: Diagnosis not present

## 2020-09-26 DIAGNOSIS — R7989 Other specified abnormal findings of blood chemistry: Secondary | ICD-10-CM | POA: Diagnosis not present

## 2020-09-26 DIAGNOSIS — R7309 Other abnormal glucose: Secondary | ICD-10-CM | POA: Diagnosis not present

## 2020-09-26 DIAGNOSIS — Z0000 Encounter for general adult medical examination without abnormal findings: Secondary | ICD-10-CM | POA: Diagnosis not present

## 2020-09-26 MED ORDER — BISOPROLOL-HYDROCHLOROTHIAZIDE 5-6.25 MG PO TABS
ORAL_TABLET | ORAL | 0 refills | Status: DC
  Filled 2020-09-26: qty 30, 30d supply, fill #0

## 2020-10-16 DIAGNOSIS — Z0000 Encounter for general adult medical examination without abnormal findings: Secondary | ICD-10-CM | POA: Diagnosis not present

## 2020-10-28 ENCOUNTER — Other Ambulatory Visit (HOSPITAL_BASED_OUTPATIENT_CLINIC_OR_DEPARTMENT_OTHER): Payer: Self-pay

## 2020-10-28 MED FILL — Sertraline HCl Tab 100 MG: ORAL | 90 days supply | Qty: 90 | Fill #1 | Status: AC

## 2020-10-29 ENCOUNTER — Other Ambulatory Visit (HOSPITAL_BASED_OUTPATIENT_CLINIC_OR_DEPARTMENT_OTHER): Payer: Self-pay

## 2020-10-29 MED ORDER — BISOPROLOL-HYDROCHLOROTHIAZIDE 5-6.25 MG PO TABS
1.0000 | ORAL_TABLET | Freq: Every day | ORAL | 11 refills | Status: DC
  Filled 2020-10-29: qty 30, 30d supply, fill #0
  Filled 2020-12-06: qty 30, 30d supply, fill #1
  Filled 2021-01-02: qty 30, 30d supply, fill #2
  Filled 2021-02-12: qty 30, 30d supply, fill #3
  Filled 2021-03-09: qty 30, 30d supply, fill #4
  Filled 2021-05-30: qty 30, 30d supply, fill #5
  Filled 2021-07-28: qty 30, 30d supply, fill #6

## 2020-11-14 DIAGNOSIS — H5211 Myopia, right eye: Secondary | ICD-10-CM | POA: Diagnosis not present

## 2020-12-06 ENCOUNTER — Other Ambulatory Visit (HOSPITAL_BASED_OUTPATIENT_CLINIC_OR_DEPARTMENT_OTHER): Payer: Self-pay

## 2020-12-06 MED ORDER — NORETHIN ACE-ETH ESTRAD-FE 1-20 MG-MCG PO TABS
1.0000 | ORAL_TABLET | Freq: Every day | ORAL | 0 refills | Status: DC
  Filled 2020-12-06: qty 84, 84d supply, fill #0

## 2021-01-02 ENCOUNTER — Other Ambulatory Visit (HOSPITAL_BASED_OUTPATIENT_CLINIC_OR_DEPARTMENT_OTHER): Payer: Self-pay

## 2021-01-02 DIAGNOSIS — F419 Anxiety disorder, unspecified: Secondary | ICD-10-CM | POA: Diagnosis not present

## 2021-01-02 DIAGNOSIS — R41840 Attention and concentration deficit: Secondary | ICD-10-CM | POA: Diagnosis not present

## 2021-01-02 DIAGNOSIS — F338 Other recurrent depressive disorders: Secondary | ICD-10-CM | POA: Diagnosis not present

## 2021-01-02 MED ORDER — VYVANSE 10 MG PO CAPS
ORAL_CAPSULE | ORAL | 0 refills | Status: DC
  Filled 2021-01-02: qty 30, 30d supply, fill #0

## 2021-02-04 ENCOUNTER — Other Ambulatory Visit (HOSPITAL_BASED_OUTPATIENT_CLINIC_OR_DEPARTMENT_OTHER): Payer: Self-pay

## 2021-02-04 MED ORDER — AMPHETAMINE-DEXTROAMPHET ER 10 MG PO CP24
ORAL_CAPSULE | ORAL | 0 refills | Status: DC
  Filled 2021-02-04: qty 30, 30d supply, fill #0

## 2021-02-06 DIAGNOSIS — Z79899 Other long term (current) drug therapy: Secondary | ICD-10-CM | POA: Diagnosis not present

## 2021-02-06 DIAGNOSIS — F902 Attention-deficit hyperactivity disorder, combined type: Secondary | ICD-10-CM | POA: Diagnosis not present

## 2021-02-10 DIAGNOSIS — F902 Attention-deficit hyperactivity disorder, combined type: Secondary | ICD-10-CM | POA: Diagnosis not present

## 2021-02-12 ENCOUNTER — Other Ambulatory Visit (HOSPITAL_BASED_OUTPATIENT_CLINIC_OR_DEPARTMENT_OTHER): Payer: Self-pay

## 2021-02-12 MED FILL — Sertraline HCl Tab 100 MG: ORAL | 90 days supply | Qty: 90 | Fill #2 | Status: AC

## 2021-02-17 ENCOUNTER — Other Ambulatory Visit (HOSPITAL_BASED_OUTPATIENT_CLINIC_OR_DEPARTMENT_OTHER): Payer: Self-pay

## 2021-03-09 ENCOUNTER — Other Ambulatory Visit (HOSPITAL_BASED_OUTPATIENT_CLINIC_OR_DEPARTMENT_OTHER): Payer: Self-pay

## 2021-03-10 ENCOUNTER — Other Ambulatory Visit (HOSPITAL_BASED_OUTPATIENT_CLINIC_OR_DEPARTMENT_OTHER): Payer: Self-pay

## 2021-03-10 MED ORDER — NORETHIN ACE-ETH ESTRAD-FE 1-20 MG-MCG PO TABS
1.0000 | ORAL_TABLET | Freq: Every day | ORAL | 0 refills | Status: DC
  Filled 2021-03-10: qty 84, 84d supply, fill #0

## 2021-03-25 ENCOUNTER — Other Ambulatory Visit: Payer: Self-pay

## 2021-03-26 ENCOUNTER — Other Ambulatory Visit: Payer: Self-pay

## 2021-03-28 ENCOUNTER — Other Ambulatory Visit (HOSPITAL_BASED_OUTPATIENT_CLINIC_OR_DEPARTMENT_OTHER): Payer: Self-pay

## 2021-03-28 MED ORDER — AMPHETAMINE-DEXTROAMPHET ER 15 MG PO CP24
ORAL_CAPSULE | ORAL | 0 refills | Status: DC
  Filled 2021-03-28: qty 30, 30d supply, fill #0

## 2021-03-31 ENCOUNTER — Other Ambulatory Visit: Payer: Self-pay

## 2021-05-08 ENCOUNTER — Other Ambulatory Visit (HOSPITAL_BASED_OUTPATIENT_CLINIC_OR_DEPARTMENT_OTHER): Payer: Self-pay

## 2021-05-08 MED ORDER — AMPHETAMINE-DEXTROAMPHET ER 15 MG PO CP24
ORAL_CAPSULE | ORAL | 0 refills | Status: DC
  Filled 2021-05-08: qty 30, 30d supply, fill #0

## 2021-05-30 ENCOUNTER — Other Ambulatory Visit (HOSPITAL_BASED_OUTPATIENT_CLINIC_OR_DEPARTMENT_OTHER): Payer: Self-pay

## 2021-06-02 ENCOUNTER — Other Ambulatory Visit (HOSPITAL_BASED_OUTPATIENT_CLINIC_OR_DEPARTMENT_OTHER): Payer: Self-pay

## 2021-06-02 MED ORDER — NORETHIN ACE-ETH ESTRAD-FE 1-20 MG-MCG PO TABS
1.0000 | ORAL_TABLET | Freq: Every day | ORAL | 0 refills | Status: DC
  Filled 2021-06-02: qty 84, 84d supply, fill #0

## 2021-06-19 ENCOUNTER — Other Ambulatory Visit (HOSPITAL_BASED_OUTPATIENT_CLINIC_OR_DEPARTMENT_OTHER): Payer: Self-pay

## 2021-06-19 ENCOUNTER — Other Ambulatory Visit: Payer: Self-pay

## 2021-06-19 MED ORDER — SERTRALINE HCL 100 MG PO TABS
ORAL_TABLET | ORAL | 3 refills | Status: DC
  Filled 2021-06-19: qty 90, 90d supply, fill #0
  Filled 2021-09-17: qty 90, 90d supply, fill #1

## 2021-06-20 ENCOUNTER — Other Ambulatory Visit (HOSPITAL_BASED_OUTPATIENT_CLINIC_OR_DEPARTMENT_OTHER): Payer: Self-pay

## 2021-06-23 ENCOUNTER — Other Ambulatory Visit (HOSPITAL_BASED_OUTPATIENT_CLINIC_OR_DEPARTMENT_OTHER): Payer: Self-pay

## 2021-06-25 ENCOUNTER — Other Ambulatory Visit (HOSPITAL_BASED_OUTPATIENT_CLINIC_OR_DEPARTMENT_OTHER): Payer: Self-pay

## 2021-06-25 MED ORDER — AMPHETAMINE-DEXTROAMPHET ER 15 MG PO CP24
ORAL_CAPSULE | ORAL | 0 refills | Status: DC
  Filled 2021-06-25: qty 30, 30d supply, fill #0

## 2021-06-26 DIAGNOSIS — Z79899 Other long term (current) drug therapy: Secondary | ICD-10-CM | POA: Diagnosis not present

## 2021-06-26 DIAGNOSIS — F902 Attention-deficit hyperactivity disorder, combined type: Secondary | ICD-10-CM | POA: Diagnosis not present

## 2021-07-02 DIAGNOSIS — E669 Obesity, unspecified: Secondary | ICD-10-CM | POA: Diagnosis not present

## 2021-07-02 DIAGNOSIS — Z13220 Encounter for screening for lipoid disorders: Secondary | ICD-10-CM | POA: Diagnosis not present

## 2021-07-02 DIAGNOSIS — R946 Abnormal results of thyroid function studies: Secondary | ICD-10-CM | POA: Diagnosis not present

## 2021-07-02 DIAGNOSIS — I10 Essential (primary) hypertension: Secondary | ICD-10-CM | POA: Diagnosis not present

## 2021-07-02 DIAGNOSIS — F909 Attention-deficit hyperactivity disorder, unspecified type: Secondary | ICD-10-CM | POA: Diagnosis not present

## 2021-07-02 DIAGNOSIS — R7309 Other abnormal glucose: Secondary | ICD-10-CM | POA: Diagnosis not present

## 2021-07-08 ENCOUNTER — Other Ambulatory Visit (HOSPITAL_BASED_OUTPATIENT_CLINIC_OR_DEPARTMENT_OTHER): Payer: Self-pay

## 2021-07-08 MED ORDER — WEGOVY 0.5 MG/0.5ML ~~LOC~~ SOAJ
SUBCUTANEOUS | 0 refills | Status: DC
  Filled 2021-07-08 – 2021-07-28 (×2): qty 2, 28d supply, fill #0

## 2021-07-09 ENCOUNTER — Other Ambulatory Visit (HOSPITAL_BASED_OUTPATIENT_CLINIC_OR_DEPARTMENT_OTHER): Payer: Self-pay

## 2021-07-10 ENCOUNTER — Other Ambulatory Visit (HOSPITAL_BASED_OUTPATIENT_CLINIC_OR_DEPARTMENT_OTHER): Payer: Self-pay

## 2021-07-11 ENCOUNTER — Other Ambulatory Visit (HOSPITAL_BASED_OUTPATIENT_CLINIC_OR_DEPARTMENT_OTHER): Payer: Self-pay

## 2021-07-14 ENCOUNTER — Other Ambulatory Visit (HOSPITAL_BASED_OUTPATIENT_CLINIC_OR_DEPARTMENT_OTHER): Payer: Self-pay

## 2021-07-15 ENCOUNTER — Other Ambulatory Visit (HOSPITAL_BASED_OUTPATIENT_CLINIC_OR_DEPARTMENT_OTHER): Payer: Self-pay

## 2021-07-16 ENCOUNTER — Other Ambulatory Visit (HOSPITAL_BASED_OUTPATIENT_CLINIC_OR_DEPARTMENT_OTHER): Payer: Self-pay

## 2021-07-17 ENCOUNTER — Other Ambulatory Visit (HOSPITAL_BASED_OUTPATIENT_CLINIC_OR_DEPARTMENT_OTHER): Payer: Self-pay

## 2021-07-18 ENCOUNTER — Other Ambulatory Visit (HOSPITAL_BASED_OUTPATIENT_CLINIC_OR_DEPARTMENT_OTHER): Payer: Self-pay

## 2021-07-21 ENCOUNTER — Other Ambulatory Visit (HOSPITAL_BASED_OUTPATIENT_CLINIC_OR_DEPARTMENT_OTHER): Payer: Self-pay

## 2021-07-28 ENCOUNTER — Other Ambulatory Visit (HOSPITAL_BASED_OUTPATIENT_CLINIC_OR_DEPARTMENT_OTHER): Payer: Self-pay

## 2021-07-29 ENCOUNTER — Other Ambulatory Visit (HOSPITAL_BASED_OUTPATIENT_CLINIC_OR_DEPARTMENT_OTHER): Payer: Self-pay

## 2021-07-29 MED ORDER — WEGOVY 0.5 MG/0.5ML ~~LOC~~ SOAJ
SUBCUTANEOUS | 0 refills | Status: DC
  Filled 2021-07-29 – 2021-11-13 (×2): qty 2, 28d supply, fill #0

## 2021-07-30 ENCOUNTER — Other Ambulatory Visit (HOSPITAL_BASED_OUTPATIENT_CLINIC_OR_DEPARTMENT_OTHER): Payer: Self-pay

## 2021-07-31 ENCOUNTER — Other Ambulatory Visit (HOSPITAL_BASED_OUTPATIENT_CLINIC_OR_DEPARTMENT_OTHER): Payer: Self-pay

## 2021-08-01 ENCOUNTER — Other Ambulatory Visit (HOSPITAL_BASED_OUTPATIENT_CLINIC_OR_DEPARTMENT_OTHER): Payer: Self-pay

## 2021-08-04 ENCOUNTER — Other Ambulatory Visit (HOSPITAL_BASED_OUTPATIENT_CLINIC_OR_DEPARTMENT_OTHER): Payer: Self-pay

## 2021-08-04 MED ORDER — AMPHETAMINE-DEXTROAMPHET ER 15 MG PO CP24
ORAL_CAPSULE | Freq: Every morning | ORAL | 0 refills | Status: DC
Start: 2021-08-04 — End: 2023-08-19
  Filled 2021-08-04: qty 30, 30d supply, fill #0

## 2021-08-05 ENCOUNTER — Other Ambulatory Visit (HOSPITAL_BASED_OUTPATIENT_CLINIC_OR_DEPARTMENT_OTHER): Payer: Self-pay

## 2021-08-06 ENCOUNTER — Other Ambulatory Visit (HOSPITAL_BASED_OUTPATIENT_CLINIC_OR_DEPARTMENT_OTHER): Payer: Self-pay

## 2021-08-08 ENCOUNTER — Other Ambulatory Visit (HOSPITAL_BASED_OUTPATIENT_CLINIC_OR_DEPARTMENT_OTHER): Payer: Self-pay

## 2021-08-08 MED ORDER — FLUCONAZOLE 150 MG PO TABS
ORAL_TABLET | ORAL | 0 refills | Status: DC
  Filled 2021-08-08: qty 2, 3d supply, fill #0

## 2021-08-12 ENCOUNTER — Other Ambulatory Visit (HOSPITAL_BASED_OUTPATIENT_CLINIC_OR_DEPARTMENT_OTHER): Payer: Self-pay

## 2021-08-15 ENCOUNTER — Other Ambulatory Visit (HOSPITAL_BASED_OUTPATIENT_CLINIC_OR_DEPARTMENT_OTHER): Payer: Self-pay

## 2021-08-20 ENCOUNTER — Other Ambulatory Visit (HOSPITAL_BASED_OUTPATIENT_CLINIC_OR_DEPARTMENT_OTHER): Payer: Self-pay

## 2021-08-20 DIAGNOSIS — Z6834 Body mass index (BMI) 34.0-34.9, adult: Secondary | ICD-10-CM | POA: Diagnosis not present

## 2021-08-20 DIAGNOSIS — Z760 Encounter for issue of repeat prescription: Secondary | ICD-10-CM | POA: Diagnosis not present

## 2021-08-20 DIAGNOSIS — Z01419 Encounter for gynecological examination (general) (routine) without abnormal findings: Secondary | ICD-10-CM | POA: Diagnosis not present

## 2021-08-20 MED ORDER — NORETHIN ACE-ETH ESTRAD-FE 1-20 MG-MCG PO TABS
1.0000 | ORAL_TABLET | Freq: Every day | ORAL | 4 refills | Status: DC
  Filled 2021-08-20: qty 84, 84d supply, fill #0
  Filled 2021-11-13: qty 84, 84d supply, fill #1

## 2021-09-15 ENCOUNTER — Other Ambulatory Visit: Payer: Self-pay | Admitting: Physician Assistant

## 2021-09-15 DIAGNOSIS — E785 Hyperlipidemia, unspecified: Secondary | ICD-10-CM

## 2021-09-15 DIAGNOSIS — R7309 Other abnormal glucose: Secondary | ICD-10-CM

## 2021-09-15 DIAGNOSIS — R946 Abnormal results of thyroid function studies: Secondary | ICD-10-CM

## 2021-09-15 DIAGNOSIS — I10 Essential (primary) hypertension: Secondary | ICD-10-CM

## 2021-09-17 ENCOUNTER — Other Ambulatory Visit: Payer: 59

## 2021-09-17 ENCOUNTER — Other Ambulatory Visit (HOSPITAL_BASED_OUTPATIENT_CLINIC_OR_DEPARTMENT_OTHER): Payer: Self-pay

## 2021-09-17 ENCOUNTER — Other Ambulatory Visit: Payer: Self-pay

## 2021-09-17 DIAGNOSIS — E785 Hyperlipidemia, unspecified: Secondary | ICD-10-CM

## 2021-09-17 DIAGNOSIS — R7309 Other abnormal glucose: Secondary | ICD-10-CM

## 2021-09-17 DIAGNOSIS — I10 Essential (primary) hypertension: Secondary | ICD-10-CM

## 2021-09-17 DIAGNOSIS — R946 Abnormal results of thyroid function studies: Secondary | ICD-10-CM

## 2021-09-18 LAB — COMPREHENSIVE METABOLIC PANEL
ALT: 19 IU/L (ref 0–32)
AST: 18 IU/L (ref 0–40)
Albumin/Globulin Ratio: 1.4 (ref 1.2–2.2)
Albumin: 4.7 g/dL (ref 3.8–4.8)
Alkaline Phosphatase: 79 IU/L (ref 44–121)
BUN/Creatinine Ratio: 10 (ref 9–23)
BUN: 9 mg/dL (ref 6–20)
Bilirubin Total: 0.4 mg/dL (ref 0.0–1.2)
CO2: 24 mmol/L (ref 20–29)
Calcium: 9.9 mg/dL (ref 8.7–10.2)
Chloride: 101 mmol/L (ref 96–106)
Creatinine, Ser: 0.89 mg/dL (ref 0.57–1.00)
Globulin, Total: 3.3 g/dL (ref 1.5–4.5)
Glucose: 83 mg/dL (ref 70–99)
Potassium: 3.4 mmol/L — ABNORMAL LOW (ref 3.5–5.2)
Sodium: 141 mmol/L (ref 134–144)
Total Protein: 8 g/dL (ref 6.0–8.5)
eGFR: 85 mL/min/{1.73_m2} (ref >59)

## 2021-09-18 LAB — TSH: TSH: 0.632 u[IU]/mL (ref 0.450–4.500)

## 2021-09-18 LAB — LIPID PANEL
Chol/HDL Ratio: 4.8 ratio — ABNORMAL HIGH (ref 0.0–4.4)
Cholesterol, Total: 232 mg/dL — ABNORMAL HIGH (ref 100–199)
HDL: 48 mg/dL (ref >39)
LDL Chol Calc (NIH): 168 mg/dL — ABNORMAL HIGH (ref 0–99)
Triglycerides: 89 mg/dL (ref 0–149)
VLDL Cholesterol Cal: 16 mg/dL (ref 5–40)

## 2021-09-18 LAB — CBC WITH DIFFERENTIAL/PLATELET
Basophils Absolute: 0 10*3/uL (ref 0.0–0.2)
Basos: 0 %
EOS (ABSOLUTE): 0.2 10*3/uL (ref 0.0–0.4)
Eos: 2 %
Hematocrit: 38.5 % (ref 34.0–46.6)
Hemoglobin: 13.1 g/dL (ref 11.1–15.9)
Immature Grans (Abs): 0 10*3/uL (ref 0.0–0.1)
Immature Granulocytes: 0 %
Lymphocytes Absolute: 3 10*3/uL (ref 0.7–3.1)
Lymphs: 36 %
MCH: 27.8 pg (ref 26.6–33.0)
MCHC: 34 g/dL (ref 31.5–35.7)
MCV: 82 fL (ref 79–97)
Monocytes Absolute: 0.6 10*3/uL (ref 0.1–0.9)
Monocytes: 7 %
Neutrophils Absolute: 4.6 10*3/uL (ref 1.4–7.0)
Neutrophils: 55 %
Platelets: 471 10*3/uL — ABNORMAL HIGH (ref 150–450)
RBC: 4.71 x10E6/uL (ref 3.77–5.28)
RDW: 12.1 % (ref 11.7–15.4)
WBC: 8.4 10*3/uL (ref 3.4–10.8)

## 2021-09-18 LAB — T4, FREE: Free T4: 1.14 ng/dL (ref 0.82–1.77)

## 2021-09-18 LAB — HEMOGLOBIN A1C
Est. average glucose Bld gHb Est-mCnc: 105 mg/dL
Hgb A1c MFr Bld: 5.3 % (ref 4.8–5.6)

## 2021-09-19 ENCOUNTER — Other Ambulatory Visit (HOSPITAL_BASED_OUTPATIENT_CLINIC_OR_DEPARTMENT_OTHER): Payer: Self-pay

## 2021-09-22 ENCOUNTER — Other Ambulatory Visit (HOSPITAL_BASED_OUTPATIENT_CLINIC_OR_DEPARTMENT_OTHER): Payer: Self-pay

## 2021-09-23 ENCOUNTER — Other Ambulatory Visit (HOSPITAL_BASED_OUTPATIENT_CLINIC_OR_DEPARTMENT_OTHER): Payer: Self-pay

## 2021-09-24 DIAGNOSIS — R7309 Other abnormal glucose: Secondary | ICD-10-CM | POA: Diagnosis not present

## 2021-09-24 DIAGNOSIS — I10 Essential (primary) hypertension: Secondary | ICD-10-CM | POA: Diagnosis not present

## 2021-09-24 DIAGNOSIS — F418 Other specified anxiety disorders: Secondary | ICD-10-CM | POA: Diagnosis not present

## 2021-09-24 DIAGNOSIS — E7800 Pure hypercholesterolemia, unspecified: Secondary | ICD-10-CM | POA: Diagnosis not present

## 2021-09-24 DIAGNOSIS — F909 Attention-deficit hyperactivity disorder, unspecified type: Secondary | ICD-10-CM | POA: Diagnosis not present

## 2021-09-24 DIAGNOSIS — Z0000 Encounter for general adult medical examination without abnormal findings: Secondary | ICD-10-CM | POA: Diagnosis not present

## 2021-09-24 DIAGNOSIS — R946 Abnormal results of thyroid function studies: Secondary | ICD-10-CM | POA: Diagnosis not present

## 2021-09-25 ENCOUNTER — Other Ambulatory Visit (HOSPITAL_BASED_OUTPATIENT_CLINIC_OR_DEPARTMENT_OTHER): Payer: Self-pay

## 2021-09-25 DIAGNOSIS — Z79899 Other long term (current) drug therapy: Secondary | ICD-10-CM | POA: Diagnosis not present

## 2021-09-25 DIAGNOSIS — F902 Attention-deficit hyperactivity disorder, combined type: Secondary | ICD-10-CM | POA: Diagnosis not present

## 2021-09-25 MED ORDER — METHYLPHENIDATE HCL ER (OSM) 18 MG PO TBCR
EXTENDED_RELEASE_TABLET | ORAL | 0 refills | Status: DC
  Filled 2021-09-25: qty 30, 30d supply, fill #0

## 2021-10-03 ENCOUNTER — Other Ambulatory Visit: Payer: Self-pay | Admitting: Physician Assistant

## 2021-10-03 ENCOUNTER — Other Ambulatory Visit: Payer: 59

## 2021-10-03 DIAGNOSIS — E559 Vitamin D deficiency, unspecified: Secondary | ICD-10-CM

## 2021-10-04 LAB — VITAMIN D 25 HYDROXY (VIT D DEFICIENCY, FRACTURES): Vit D, 25-Hydroxy: 44.4 ng/mL (ref 30.0–100.0)

## 2021-10-06 ENCOUNTER — Other Ambulatory Visit: Payer: 59

## 2021-11-13 ENCOUNTER — Other Ambulatory Visit (HOSPITAL_BASED_OUTPATIENT_CLINIC_OR_DEPARTMENT_OTHER): Payer: Self-pay

## 2021-11-13 MED ORDER — BISOPROLOL-HYDROCHLOROTHIAZIDE 5-6.25 MG PO TABS
1.0000 | ORAL_TABLET | Freq: Every day | ORAL | 11 refills | Status: DC
Start: 2021-11-13 — End: 2023-08-19
  Filled 2021-11-13: qty 30, 30d supply, fill #0

## 2021-11-14 ENCOUNTER — Other Ambulatory Visit (HOSPITAL_BASED_OUTPATIENT_CLINIC_OR_DEPARTMENT_OTHER): Payer: Self-pay

## 2021-11-18 ENCOUNTER — Other Ambulatory Visit (HOSPITAL_BASED_OUTPATIENT_CLINIC_OR_DEPARTMENT_OTHER): Payer: Self-pay

## 2021-12-03 ENCOUNTER — Other Ambulatory Visit (HOSPITAL_BASED_OUTPATIENT_CLINIC_OR_DEPARTMENT_OTHER): Payer: Self-pay

## 2021-12-04 IMAGING — MG MM DIGITAL SCREENING BILAT W/ TOMO AND CAD
8 series · 9 of 24 positions shown · non-contrast
Comparison: None.

CLINICAL DATA: Screening.

EXAM:
DIGITAL SCREENING BILATERAL MAMMOGRAM WITH TOMOSYNTHESIS AND CAD
TECHNIQUE: Bilateral screening digital craniocaudal and mediolateral oblique
mammograms were obtained. Bilateral screening digital breast
tomosynthesis was performed. The images were evaluated with
computer-aided detection.

[L CC synth-2D]
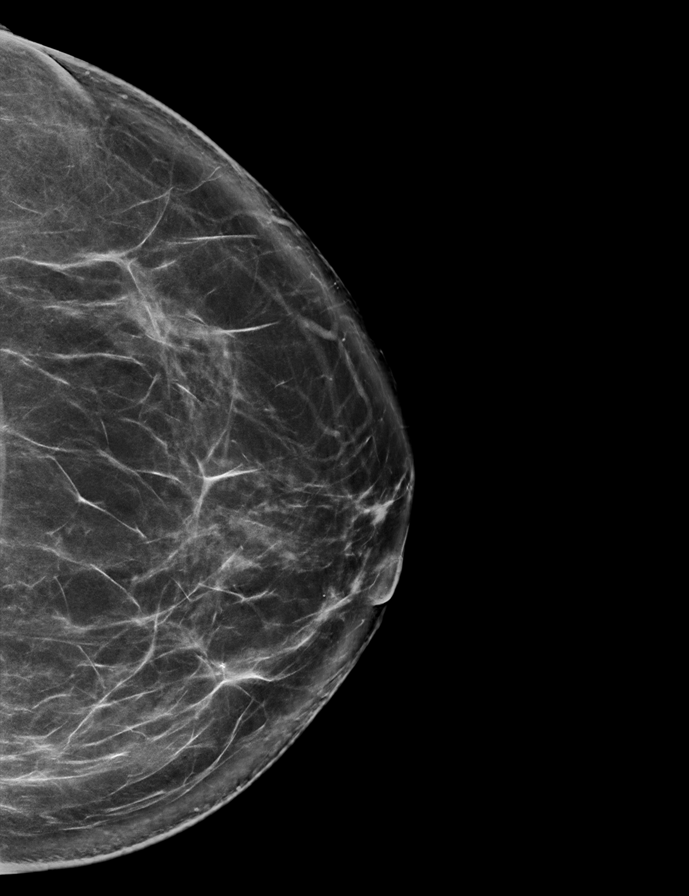

[R CC synth-2D]
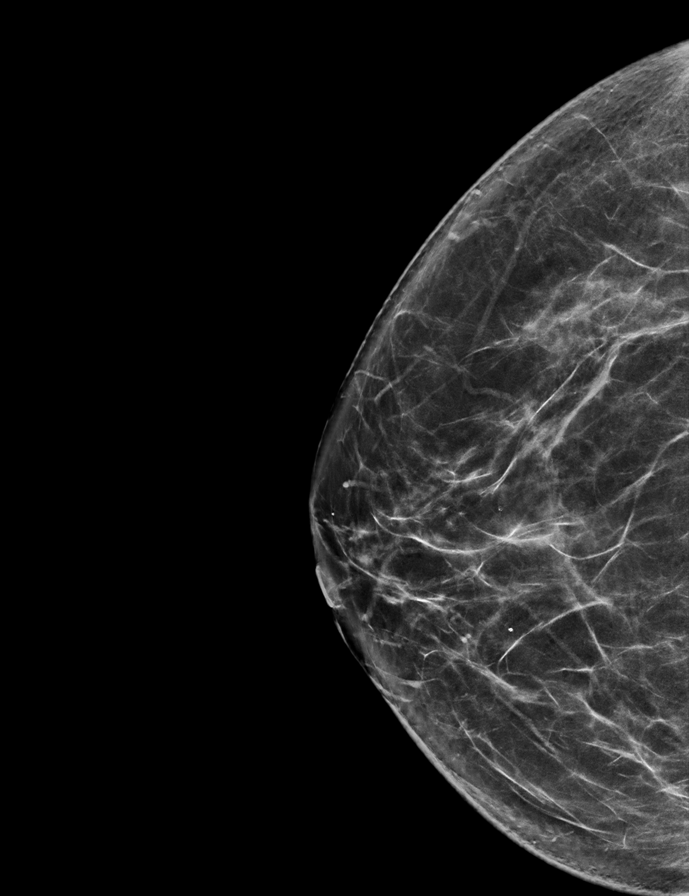

[R MLO synth-2D]
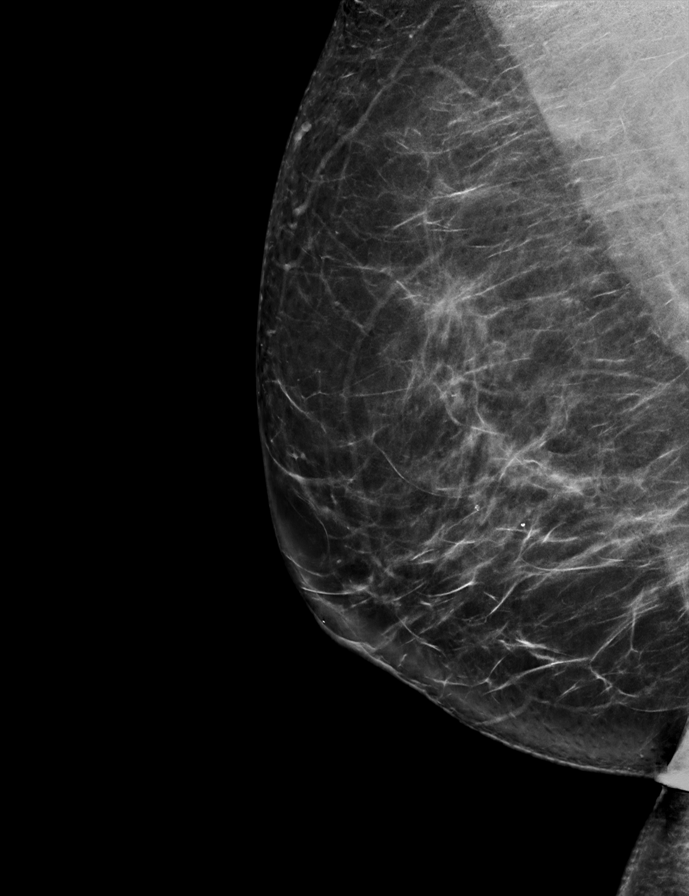

[L MLO synth-2D]
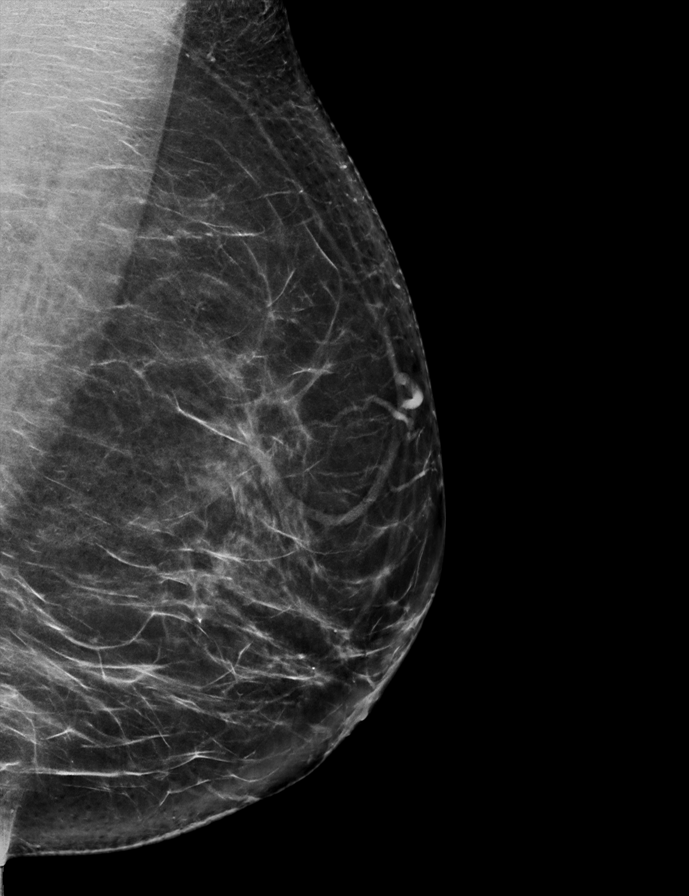

[R CC tomo · 2 of 79 frames shown]
[frame 26/79]
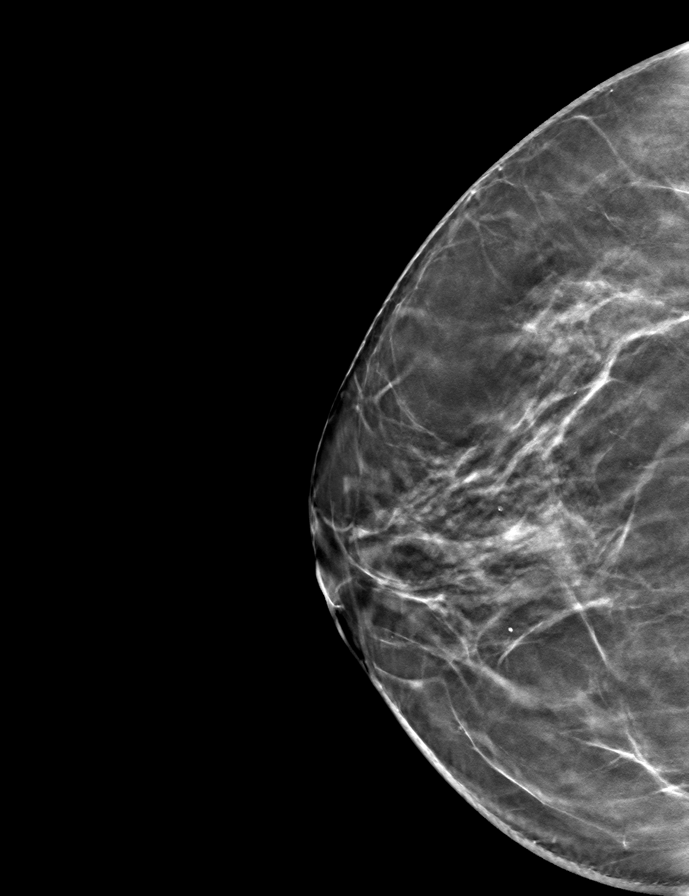
[frame 40/79]
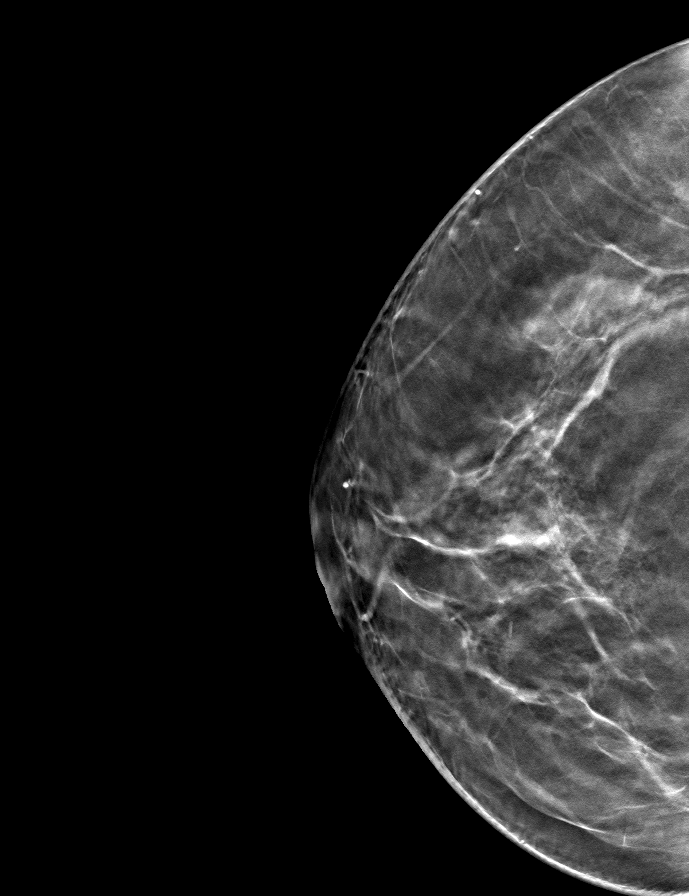

[L MLO tomo · tomo slice 42/83.0]
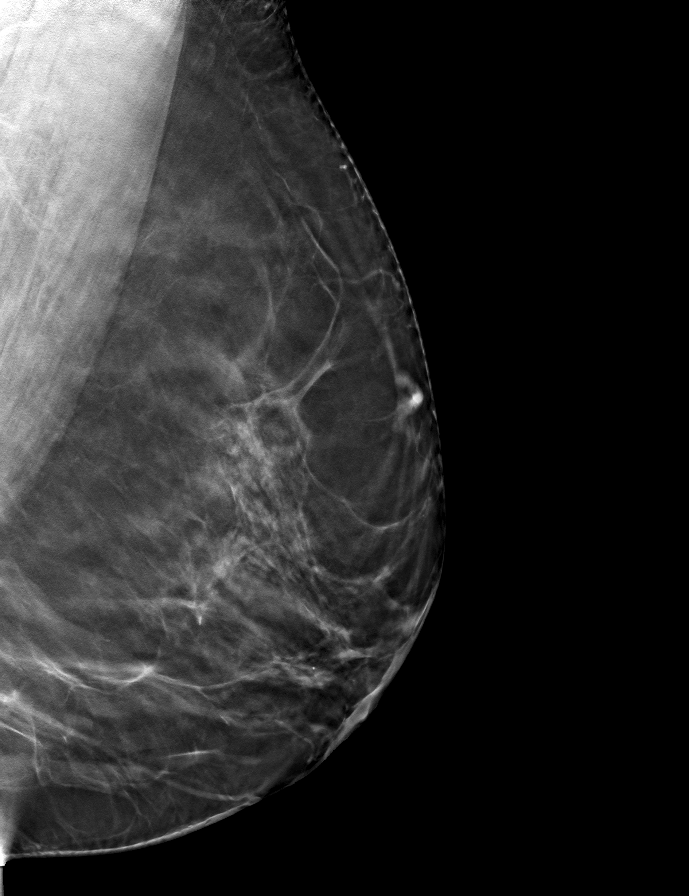

[L CC tomo · tomo slice 43/86.0]
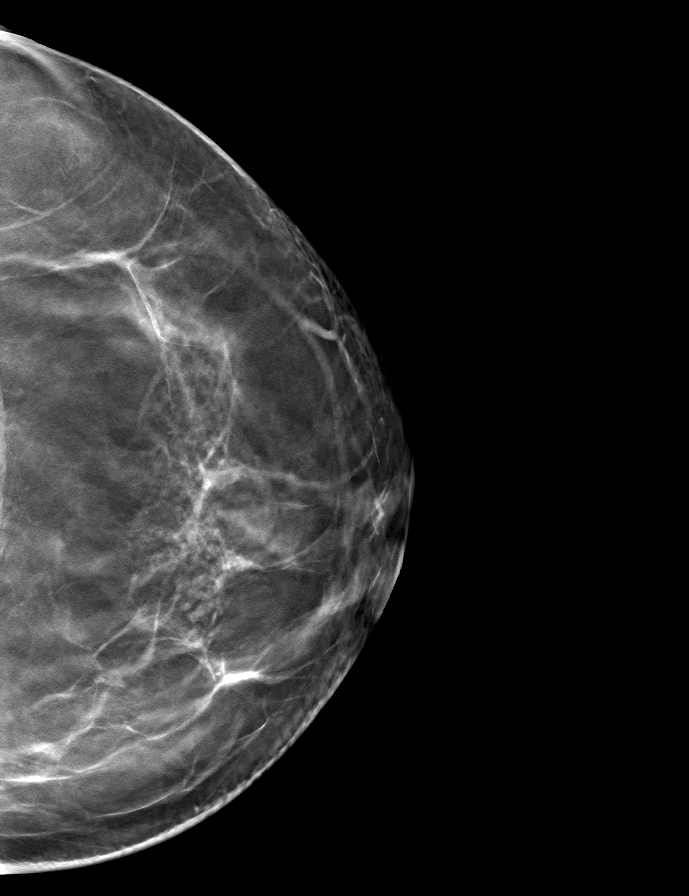

[R MLO tomo · tomo slice 41/80.0]
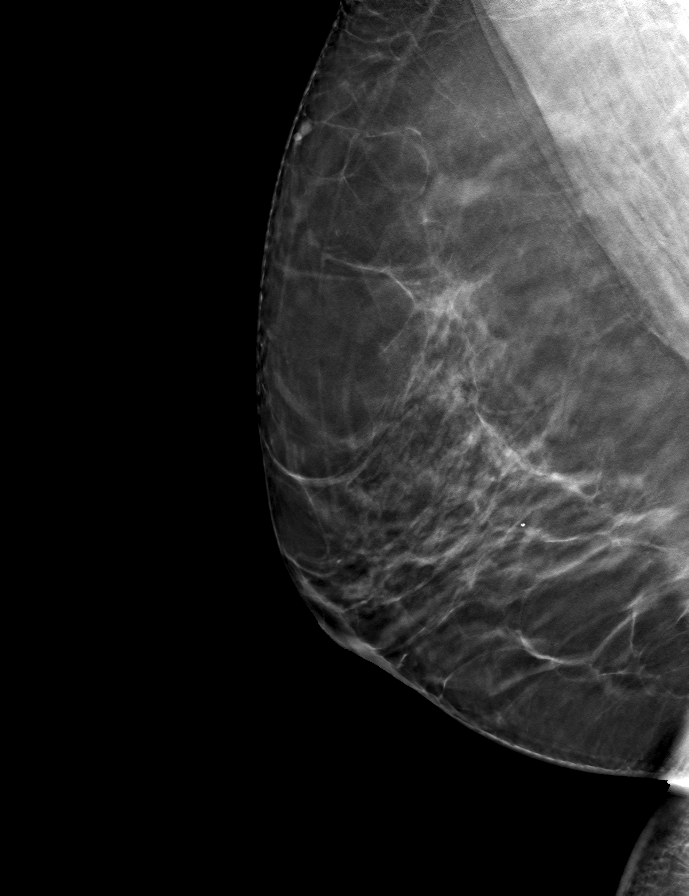

[9 of 24 positions shown; findings below may reference images not displayed]

ACR Breast Density Category b: There are scattered areas of
fibroglandular density.
FINDINGS: There are no findings suspicious for malignancy. The images were
evaluated with computer-aided detection.
IMPRESSION: No mammographic evidence of malignancy. A result letter of this
screening mammogram will be mailed directly to the patient.

RECOMMENDATION:
Screening mammogram at age 42. (Code:EH-A-SBG)

BI-RADS CATEGORY  1: Negative.

## 2021-12-11 ENCOUNTER — Other Ambulatory Visit (HOSPITAL_BASED_OUTPATIENT_CLINIC_OR_DEPARTMENT_OTHER): Payer: Self-pay

## 2021-12-18 ENCOUNTER — Other Ambulatory Visit (HOSPITAL_BASED_OUTPATIENT_CLINIC_OR_DEPARTMENT_OTHER): Payer: Self-pay

## 2021-12-18 MED ORDER — METHYLPHENIDATE HCL ER (OSM) 18 MG PO TBCR
18.0000 mg | EXTENDED_RELEASE_TABLET | Freq: Every day | ORAL | 0 refills | Status: DC
  Filled 2021-12-18 – 2021-12-23 (×5): qty 30, 30d supply, fill #0

## 2021-12-19 ENCOUNTER — Other Ambulatory Visit (HOSPITAL_BASED_OUTPATIENT_CLINIC_OR_DEPARTMENT_OTHER): Payer: Self-pay

## 2021-12-22 ENCOUNTER — Other Ambulatory Visit (HOSPITAL_BASED_OUTPATIENT_CLINIC_OR_DEPARTMENT_OTHER): Payer: Self-pay

## 2021-12-23 ENCOUNTER — Other Ambulatory Visit (HOSPITAL_BASED_OUTPATIENT_CLINIC_OR_DEPARTMENT_OTHER): Payer: Self-pay

## 2021-12-29 ENCOUNTER — Other Ambulatory Visit (HOSPITAL_BASED_OUTPATIENT_CLINIC_OR_DEPARTMENT_OTHER): Payer: Self-pay

## 2022-01-02 ENCOUNTER — Other Ambulatory Visit (HOSPITAL_BASED_OUTPATIENT_CLINIC_OR_DEPARTMENT_OTHER): Payer: Self-pay

## 2022-09-11 DIAGNOSIS — F902 Attention-deficit hyperactivity disorder, combined type: Secondary | ICD-10-CM | POA: Diagnosis not present

## 2022-09-11 DIAGNOSIS — Z79899 Other long term (current) drug therapy: Secondary | ICD-10-CM | POA: Diagnosis not present

## 2022-09-21 DIAGNOSIS — Z13220 Encounter for screening for lipoid disorders: Secondary | ICD-10-CM | POA: Diagnosis not present

## 2022-09-21 DIAGNOSIS — R946 Abnormal results of thyroid function studies: Secondary | ICD-10-CM | POA: Diagnosis not present

## 2022-09-21 DIAGNOSIS — I10 Essential (primary) hypertension: Secondary | ICD-10-CM | POA: Diagnosis not present

## 2022-09-24 ENCOUNTER — Other Ambulatory Visit: Payer: Self-pay

## 2022-09-24 ENCOUNTER — Other Ambulatory Visit (HOSPITAL_BASED_OUTPATIENT_CLINIC_OR_DEPARTMENT_OTHER): Payer: Self-pay

## 2022-09-24 MED ORDER — NORETHIN ACE-ETH ESTRAD-FE 1-20 MG-MCG PO TABS
1.0000 | ORAL_TABLET | Freq: Every day | ORAL | 4 refills | Status: DC
Start: 2022-01-26 — End: 2023-08-19
  Filled 2022-10-19: qty 84, 84d supply, fill #0

## 2022-09-24 MED ORDER — METHYLPHENIDATE HCL ER (CD) 30 MG PO CPCR
30.0000 mg | ORAL_CAPSULE | Freq: Every day | ORAL | 0 refills | Status: DC
  Filled 2022-09-24: qty 30, 30d supply, fill #0

## 2022-09-24 MED ORDER — SERTRALINE HCL 100 MG PO TABS: 100.0000 mg | ORAL_TABLET | Freq: Every day | ORAL | 1 refills | Status: DC

## 2022-09-29 ENCOUNTER — Other Ambulatory Visit (HOSPITAL_BASED_OUTPATIENT_CLINIC_OR_DEPARTMENT_OTHER): Payer: Self-pay

## 2022-09-29 DIAGNOSIS — Z0000 Encounter for general adult medical examination without abnormal findings: Secondary | ICD-10-CM | POA: Diagnosis not present

## 2022-09-29 DIAGNOSIS — F418 Other specified anxiety disorders: Secondary | ICD-10-CM | POA: Diagnosis not present

## 2022-09-29 DIAGNOSIS — I10 Essential (primary) hypertension: Secondary | ICD-10-CM | POA: Diagnosis not present

## 2022-09-29 DIAGNOSIS — F909 Attention-deficit hyperactivity disorder, unspecified type: Secondary | ICD-10-CM | POA: Diagnosis not present

## 2022-09-29 MED ORDER — BISOPROLOL-HYDROCHLOROTHIAZIDE 5-6.25 MG PO TABS
1.0000 | ORAL_TABLET | Freq: Every day | ORAL | 3 refills | Status: DC
Start: 2022-09-29 — End: 2023-10-21
  Filled 2022-09-29: qty 90, 90d supply, fill #0
  Filled 2023-02-18: qty 90, 90d supply, fill #1
  Filled 2023-06-23: qty 90, 90d supply, fill #2

## 2022-10-02 ENCOUNTER — Other Ambulatory Visit (HOSPITAL_BASED_OUTPATIENT_CLINIC_OR_DEPARTMENT_OTHER): Payer: Self-pay

## 2022-10-02 DIAGNOSIS — F419 Anxiety disorder, unspecified: Secondary | ICD-10-CM | POA: Diagnosis not present

## 2022-10-02 DIAGNOSIS — Z79899 Other long term (current) drug therapy: Secondary | ICD-10-CM | POA: Diagnosis not present

## 2022-10-02 DIAGNOSIS — F338 Other recurrent depressive disorders: Secondary | ICD-10-CM | POA: Diagnosis not present

## 2022-10-02 MED ORDER — SERTRALINE HCL 100 MG PO TABS
150.0000 mg | ORAL_TABLET | Freq: Every day | ORAL | 3 refills | Status: DC
Start: 2022-10-02 — End: 2023-11-09
  Filled 2022-10-02: qty 135, 90d supply, fill #0
  Filled 2023-04-16: qty 135, 90d supply, fill #1

## 2022-10-19 ENCOUNTER — Other Ambulatory Visit (HOSPITAL_BASED_OUTPATIENT_CLINIC_OR_DEPARTMENT_OTHER): Payer: Self-pay

## 2022-11-04 ENCOUNTER — Other Ambulatory Visit: Payer: Self-pay | Admitting: *Deleted

## 2022-11-04 DIAGNOSIS — Z1231 Encounter for screening mammogram for malignant neoplasm of breast: Secondary | ICD-10-CM

## 2022-11-09 ENCOUNTER — Other Ambulatory Visit: Payer: Self-pay | Admitting: Family Medicine

## 2022-11-09 ENCOUNTER — Telehealth: Payer: Self-pay

## 2022-11-09 ENCOUNTER — Ambulatory Visit
Admission: RE | Admit: 2022-11-09 | Discharge: 2022-11-09 | Disposition: A | Discharge status: Home / Self Care | Payer: 59 | Source: Ambulatory Visit | Attending: Family Medicine | Admitting: Family Medicine

## 2022-11-09 DIAGNOSIS — Z1231 Encounter for screening mammogram for malignant neoplasm of breast: Secondary | ICD-10-CM | POA: Diagnosis not present

## 2022-11-11 ENCOUNTER — Other Ambulatory Visit (HOSPITAL_BASED_OUTPATIENT_CLINIC_OR_DEPARTMENT_OTHER): Payer: Self-pay

## 2022-11-11 ENCOUNTER — Other Ambulatory Visit: Payer: Self-pay | Admitting: Family Medicine

## 2022-11-11 DIAGNOSIS — R928 Other abnormal and inconclusive findings on diagnostic imaging of breast: Secondary | ICD-10-CM

## 2022-11-12 ENCOUNTER — Other Ambulatory Visit (HOSPITAL_BASED_OUTPATIENT_CLINIC_OR_DEPARTMENT_OTHER): Payer: Self-pay

## 2022-11-12 MED ORDER — METHYLPHENIDATE HCL ER (CD) 30 MG PO CPCR
30.0000 mg | ORAL_CAPSULE | Freq: Every day | ORAL | 0 refills | Status: DC
Start: 2022-11-11 — End: 2023-02-10
  Filled 2022-11-12: qty 30, 30d supply, fill #0

## 2022-11-16 ENCOUNTER — Ambulatory Visit
Admission: RE | Admit: 2022-11-16 | Discharge: 2022-11-16 | Disposition: A | Discharge status: Home / Self Care | Payer: 59 | Source: Ambulatory Visit | Attending: Family Medicine | Admitting: Family Medicine

## 2022-11-16 DIAGNOSIS — R928 Other abnormal and inconclusive findings on diagnostic imaging of breast: Secondary | ICD-10-CM

## 2022-11-19 ENCOUNTER — Other Ambulatory Visit (HOSPITAL_BASED_OUTPATIENT_CLINIC_OR_DEPARTMENT_OTHER): Payer: Self-pay

## 2022-11-19 ENCOUNTER — Other Ambulatory Visit: Payer: Self-pay

## 2022-11-19 DIAGNOSIS — Z113 Encounter for screening for infections with a predominantly sexual mode of transmission: Secondary | ICD-10-CM | POA: Diagnosis not present

## 2022-11-19 DIAGNOSIS — I10 Essential (primary) hypertension: Secondary | ICD-10-CM | POA: Diagnosis not present

## 2022-11-19 DIAGNOSIS — Z01419 Encounter for gynecological examination (general) (routine) without abnormal findings: Secondary | ICD-10-CM | POA: Diagnosis not present

## 2022-11-19 DIAGNOSIS — Z124 Encounter for screening for malignant neoplasm of cervix: Secondary | ICD-10-CM | POA: Diagnosis not present

## 2022-11-19 DIAGNOSIS — Z760 Encounter for issue of repeat prescription: Secondary | ICD-10-CM | POA: Diagnosis not present

## 2022-11-19 MED ORDER — BISOPROLOL-HYDROCHLOROTHIAZIDE 10-6.25 MG PO TABS
1.0000 | ORAL_TABLET | Freq: Every day | ORAL | 1 refills | Status: DC
  Filled 2022-11-19 – 2023-02-12 (×2): qty 30, 30d supply, fill #0

## 2022-11-19 MED ORDER — NORETHINDRONE 0.35 MG PO TABS
1.0000 | ORAL_TABLET | Freq: Every day | ORAL | 9 refills | Status: DC
  Filled 2022-11-19: qty 84, 84d supply, fill #0

## 2022-11-23 ENCOUNTER — Other Ambulatory Visit: Payer: 59

## 2022-12-02 ENCOUNTER — Other Ambulatory Visit (HOSPITAL_BASED_OUTPATIENT_CLINIC_OR_DEPARTMENT_OTHER): Payer: Self-pay

## 2022-12-03 ENCOUNTER — Other Ambulatory Visit (HOSPITAL_BASED_OUTPATIENT_CLINIC_OR_DEPARTMENT_OTHER): Payer: Self-pay

## 2022-12-03 DIAGNOSIS — I10 Essential (primary) hypertension: Secondary | ICD-10-CM | POA: Diagnosis not present

## 2022-12-03 MED ORDER — AMLODIPINE BESYLATE 5 MG PO TABS
5.0000 mg | ORAL_TABLET | Freq: Every day | ORAL | 0 refills | Status: DC
  Filled 2022-12-03: qty 30, 30d supply, fill #0

## 2022-12-17 ENCOUNTER — Other Ambulatory Visit (HOSPITAL_BASED_OUTPATIENT_CLINIC_OR_DEPARTMENT_OTHER): Payer: Self-pay

## 2022-12-17 DIAGNOSIS — I10 Essential (primary) hypertension: Secondary | ICD-10-CM | POA: Diagnosis not present

## 2022-12-17 MED ORDER — AMLODIPINE BESYLATE 5 MG PO TABS
5.0000 mg | ORAL_TABLET | Freq: Every day | ORAL | 1 refills | Status: AC
  Filled 2022-12-17 – 2022-12-28 (×3): qty 90, 90d supply, fill #0

## 2022-12-22 ENCOUNTER — Other Ambulatory Visit (HOSPITAL_BASED_OUTPATIENT_CLINIC_OR_DEPARTMENT_OTHER): Payer: Self-pay

## 2022-12-28 ENCOUNTER — Other Ambulatory Visit (HOSPITAL_BASED_OUTPATIENT_CLINIC_OR_DEPARTMENT_OTHER): Payer: Self-pay

## 2023-02-10 ENCOUNTER — Other Ambulatory Visit (HOSPITAL_BASED_OUTPATIENT_CLINIC_OR_DEPARTMENT_OTHER): Payer: Self-pay

## 2023-02-10 DIAGNOSIS — F902 Attention-deficit hyperactivity disorder, combined type: Secondary | ICD-10-CM | POA: Diagnosis not present

## 2023-02-10 DIAGNOSIS — Z79899 Other long term (current) drug therapy: Secondary | ICD-10-CM | POA: Diagnosis not present

## 2023-02-10 MED ORDER — METHYLPHENIDATE HCL ER (CD) 30 MG PO CPCR
30.0000 mg | ORAL_CAPSULE | Freq: Every day | ORAL | 0 refills | Status: DC
  Filled 2023-02-10: qty 30, 30d supply, fill #0

## 2023-02-12 ENCOUNTER — Other Ambulatory Visit (HOSPITAL_BASED_OUTPATIENT_CLINIC_OR_DEPARTMENT_OTHER): Payer: Self-pay

## 2023-02-15 ENCOUNTER — Other Ambulatory Visit (HOSPITAL_BASED_OUTPATIENT_CLINIC_OR_DEPARTMENT_OTHER): Payer: Self-pay

## 2023-02-17 ENCOUNTER — Other Ambulatory Visit (HOSPITAL_BASED_OUTPATIENT_CLINIC_OR_DEPARTMENT_OTHER): Payer: Self-pay

## 2023-02-17 DIAGNOSIS — N898 Other specified noninflammatory disorders of vagina: Secondary | ICD-10-CM | POA: Diagnosis not present

## 2023-02-17 DIAGNOSIS — Z113 Encounter for screening for infections with a predominantly sexual mode of transmission: Secondary | ICD-10-CM | POA: Diagnosis not present

## 2023-02-17 DIAGNOSIS — Z719 Counseling, unspecified: Secondary | ICD-10-CM | POA: Diagnosis not present

## 2023-02-17 MED ORDER — PRENATAL VITAMINS 28-0.8 MG PO TABS
1.0000 | ORAL_TABLET | Freq: Every day | ORAL | 3 refills | Status: AC
  Filled 2023-02-17: qty 30, 30d supply, fill #0
  Filled 2023-06-24: qty 30, 30d supply, fill #1
  Filled 2023-07-20: qty 30, 30d supply, fill #2

## 2023-02-18 ENCOUNTER — Other Ambulatory Visit (HOSPITAL_BASED_OUTPATIENT_CLINIC_OR_DEPARTMENT_OTHER): Payer: Self-pay

## 2023-02-19 ENCOUNTER — Other Ambulatory Visit: Payer: Self-pay

## 2023-03-02 ENCOUNTER — Other Ambulatory Visit (HOSPITAL_BASED_OUTPATIENT_CLINIC_OR_DEPARTMENT_OTHER): Payer: Self-pay

## 2023-03-02 MED ORDER — FLUCONAZOLE 150 MG PO TABS
ORAL_TABLET | ORAL | 0 refills | Status: DC
  Filled 2023-03-02: qty 2, 6d supply, fill #0

## 2023-04-16 ENCOUNTER — Other Ambulatory Visit (HOSPITAL_BASED_OUTPATIENT_CLINIC_OR_DEPARTMENT_OTHER): Payer: Self-pay

## 2023-04-21 ENCOUNTER — Other Ambulatory Visit (HOSPITAL_BASED_OUTPATIENT_CLINIC_OR_DEPARTMENT_OTHER): Payer: Self-pay

## 2023-04-21 ENCOUNTER — Other Ambulatory Visit: Payer: Self-pay

## 2023-04-21 MED ORDER — METHYLPHENIDATE HCL ER (CD) 30 MG PO CPCR
30.0000 mg | ORAL_CAPSULE | Freq: Every day | ORAL | 0 refills | Status: DC
Start: 2023-04-21 — End: 2023-06-09
  Filled 2023-04-21: qty 30, 30d supply, fill #0

## 2023-06-09 ENCOUNTER — Other Ambulatory Visit (HOSPITAL_BASED_OUTPATIENT_CLINIC_OR_DEPARTMENT_OTHER): Payer: Self-pay

## 2023-06-09 DIAGNOSIS — Z79899 Other long term (current) drug therapy: Secondary | ICD-10-CM | POA: Diagnosis not present

## 2023-06-09 DIAGNOSIS — F902 Attention-deficit hyperactivity disorder, combined type: Secondary | ICD-10-CM | POA: Diagnosis not present

## 2023-06-09 MED ORDER — METHYLPHENIDATE HCL ER (CD) 30 MG PO CPCR
30.0000 mg | ORAL_CAPSULE | Freq: Every day | ORAL | 0 refills | Status: DC
Start: 2023-06-09 — End: 2023-07-22
  Filled 2023-06-09: qty 30, 30d supply, fill #0

## 2023-06-10 DIAGNOSIS — F902 Attention-deficit hyperactivity disorder, combined type: Secondary | ICD-10-CM | POA: Diagnosis not present

## 2023-06-10 DIAGNOSIS — Z79899 Other long term (current) drug therapy: Secondary | ICD-10-CM | POA: Diagnosis not present

## 2023-06-23 ENCOUNTER — Other Ambulatory Visit: Payer: Self-pay

## 2023-06-24 ENCOUNTER — Other Ambulatory Visit (HOSPITAL_BASED_OUTPATIENT_CLINIC_OR_DEPARTMENT_OTHER): Payer: Self-pay

## 2023-06-24 ENCOUNTER — Other Ambulatory Visit: Payer: Self-pay

## 2023-07-20 ENCOUNTER — Other Ambulatory Visit (HOSPITAL_BASED_OUTPATIENT_CLINIC_OR_DEPARTMENT_OTHER): Payer: Self-pay

## 2023-07-21 ENCOUNTER — Other Ambulatory Visit (HOSPITAL_BASED_OUTPATIENT_CLINIC_OR_DEPARTMENT_OTHER): Payer: Self-pay

## 2023-07-22 ENCOUNTER — Other Ambulatory Visit (HOSPITAL_BASED_OUTPATIENT_CLINIC_OR_DEPARTMENT_OTHER): Payer: Self-pay

## 2023-07-22 ENCOUNTER — Encounter (HOSPITAL_BASED_OUTPATIENT_CLINIC_OR_DEPARTMENT_OTHER): Payer: Self-pay

## 2023-07-22 MED ORDER — METHYLPHENIDATE HCL ER (CD) 30 MG PO CPCR
30.0000 mg | ORAL_CAPSULE | Freq: Every day | ORAL | 0 refills | Status: DC
Start: 2023-07-22 — End: 2023-10-26
  Filled 2023-07-22 – 2023-09-03 (×2): qty 30, 30d supply, fill #0

## 2023-07-23 ENCOUNTER — Other Ambulatory Visit (HOSPITAL_BASED_OUTPATIENT_CLINIC_OR_DEPARTMENT_OTHER): Payer: Self-pay

## 2023-07-23 MED ORDER — RELEXXII 36 MG PO TBCR
36.0000 mg | EXTENDED_RELEASE_TABLET | Freq: Every day | ORAL | 0 refills | Status: AC
Start: 2023-07-23 — End: ?
  Filled 2023-07-23: qty 30, 30d supply, fill #0

## 2023-07-26 ENCOUNTER — Other Ambulatory Visit (HOSPITAL_BASED_OUTPATIENT_CLINIC_OR_DEPARTMENT_OTHER): Payer: Self-pay

## 2023-07-27 ENCOUNTER — Other Ambulatory Visit (HOSPITAL_BASED_OUTPATIENT_CLINIC_OR_DEPARTMENT_OTHER): Payer: Self-pay

## 2023-08-11 ENCOUNTER — Other Ambulatory Visit (HOSPITAL_BASED_OUTPATIENT_CLINIC_OR_DEPARTMENT_OTHER): Payer: Self-pay

## 2023-08-11 MED ORDER — LORAZEPAM 2 MG PO TABS
ORAL_TABLET | ORAL | 0 refills | Status: DC
  Filled 2023-08-11: qty 1, 1d supply, fill #0

## 2023-08-11 MED ORDER — HYDROCODONE-ACETAMINOPHEN 5-325 MG PO TABS
ORAL_TABLET | ORAL | 0 refills | Status: AC
  Filled 2023-08-11: qty 8, 2d supply, fill #0

## 2023-08-11 MED ORDER — AMOXICILLIN 250 MG PO CAPS
250.0000 mg | ORAL_CAPSULE | Freq: Three times a day (TID) | ORAL | 0 refills | Status: DC
  Filled 2023-08-11: qty 15, 5d supply, fill #0

## 2023-08-11 MED ORDER — ONDANSETRON 4 MG PO TBDP
ORAL_TABLET | ORAL | 0 refills | Status: DC
  Filled 2023-08-11: qty 6, 2d supply, fill #0

## 2023-08-19 ENCOUNTER — Other Ambulatory Visit (HOSPITAL_BASED_OUTPATIENT_CLINIC_OR_DEPARTMENT_OTHER): Payer: Self-pay

## 2023-08-19 ENCOUNTER — Encounter: Payer: Self-pay | Admitting: Urgent Care

## 2023-08-19 ENCOUNTER — Ambulatory Visit: Admitting: Urgent Care

## 2023-08-19 VITALS — BP 134/85 | HR 84 | Wt 188.8 lb

## 2023-08-19 DIAGNOSIS — R102 Pelvic and perineal pain: Secondary | ICD-10-CM | POA: Diagnosis not present

## 2023-08-19 DIAGNOSIS — R1030 Lower abdominal pain, unspecified: Secondary | ICD-10-CM | POA: Diagnosis not present

## 2023-08-19 LAB — POC URINALSYSI DIPSTICK (AUTOMATED)
Bilirubin, UA: NEGATIVE
Glucose, UA: NEGATIVE
Ketones, UA: NEGATIVE
Leukocytes, UA: NEGATIVE
Nitrite, UA: NEGATIVE
Protein, UA: POSITIVE — AB
Spec Grav, UA: 1.015 (ref 1.010–1.025)
Urobilinogen, UA: NEGATIVE U/dL — AB
pH, UA: 6.5 (ref 5.0–8.0)

## 2023-08-19 MED ORDER — ETODOLAC 500 MG PO TABS
500.0000 mg | ORAL_TABLET | Freq: Two times a day (BID) | ORAL | 0 refills | Status: DC
Start: 2023-08-19 — End: 2023-09-17
  Filled 2023-08-19: qty 30, 15d supply, fill #0

## 2023-08-19 MED ORDER — AMOXICILLIN 250 MG PO CAPS
250.0000 mg | ORAL_CAPSULE | Freq: Three times a day (TID) | ORAL | 0 refills | Status: DC
  Filled 2023-08-19: qty 15, 5d supply, fill #0

## 2023-08-19 MED ORDER — TIZANIDINE HCL 4 MG PO TABS
4.0000 mg | ORAL_TABLET | Freq: Three times a day (TID) | ORAL | 0 refills | Status: AC | PRN
Start: 2023-08-19 — End: ?
  Filled 2023-08-19: qty 30, 10d supply, fill #0

## 2023-08-19 MED ORDER — ONDANSETRON 4 MG PO TBDP
4.0000 mg | ORAL_TABLET | Freq: Three times a day (TID) | ORAL | 0 refills | Status: AC | PRN
  Filled 2023-08-19: qty 6, 2d supply, fill #0

## 2023-08-19 MED ORDER — LORAZEPAM 2 MG PO TABS
2.0000 mg | ORAL_TABLET | Freq: Once | ORAL | 0 refills | Status: AC
  Filled 2023-08-19: qty 1, 1d supply, fill #0

## 2023-08-19 NOTE — Patient Instructions (Addendum)
 Please apply a warm moist compress, such as a microwavable heating pack, to your pelvic area several times daily. You can consider an epsom salt bath as well which helps with cramping.  Take the muscle relaxer three times daily as needed. Keep in mind it may make you feel tired or drowsy, so do not operate machinery or drive a car until you know how it affects you.  Please take the anti-inflammatory medication called in today twice daily with food. Do not take any additional OTC NSAIDS (advil , motrin , ibuprofen , aleve, naproxen).    If you develop worsening pain, fever, or any new symptoms, please head to ER.

## 2023-08-19 NOTE — Progress Notes (Signed)
 Established Patient Office Visit  Subjective:  Patient ID: Renee Ellis, female    DOB: November 27, 1980  Age: 43 y.o. MRN: [id number]  Chief Complaint  Patient presents with   Abdominal Pain    Started yesterday; lower abdomen. Cramping, aching, pressure. Pt has not taken anything.     HPI   Discussed the use of AI scribe software for clinical note transcription with the patient, who gave verbal consent to proceed.  History of Present Illness   Renee Ellis is a 43 year old female who presents with lower abdominal cramping and pain.  She began experiencing lower abdominal cramping and pain yesterday, which was initially severe, particularly last night, but has improved slightly today. The pain is described as cramping and tightening, similar to contractions, located in the lower abdomen and is equal on both sides. Each episode of pain lasts about 10 to 15 seconds and occurs randomly without any specific triggers related to movement or position.  She is due for her menstrual cycle tomorrow and notes that the pain is unrelated to her usual menstrual symptoms. She experienced some clear, thicker-than-normal vaginal discharge yesterday morning, but no white or yellow discharge and no abnormal odor. A pregnancy test taken yesterday was negative.  No changes in bowel movements, diarrhea, constipation, or pain during urination. She reports frequent urination, which she attributes to her use of Aprolo hydrochlorothiazide  for blood pressure management. She has not noticed any visible blood in her urine.  She has a history of three vaginal deliveries and no known issues with ovarian cysts, although her sister has ovarian cysts. She is not currently on birth control and was prescribed prenatal vitamins by her gynecologist after stopping birth control.  She typically fasts until lunch due to her ADHD medication, which suppresses her appetite. She did eat yesterday to take pain medication for her  stomach. She has previously taken muscle relaxers for back pain after a fall while skating.       Patient Active Problem List   Diagnosis Date Noted   Elevated blood-pressure reading without diagnosis of hypertension 07/11/2020   Pressure sensation in both ears 07/11/2020   Allergic rhinitis due to pollen 07/11/2020   Sinus pressure 07/11/2020   Nonintractable headache 07/11/2020   Normal postpartum course 07/29/2018   Postpartum anemia 07/29/2018   SVD (spontaneous vaginal delivery) 07/28/2018   Indication for care in labor or delivery 07/27/2018   Past Medical History:  Diagnosis Date   Hypertension    Past Surgical History:  Procedure Laterality Date   LIPOMA EXCISION     Social History   Tobacco Use   Smoking status: Never   Smokeless tobacco: Never  Vaping Use   Vaping status: Never Used  Substance Use Topics   Alcohol use: Not Currently    Comment: prior to pregnancy   Drug use: Never      ROS: as noted in HPI  Objective:     BP 134/85   Pulse 84   Wt 188 lb 12.8 oz (85.6 kg)   LMP 07/23/2023   SpO2 100%   BMI 32.41 kg/m  BP Readings from Last 3 Encounters:  08/19/23 134/85  07/11/20 (!) 182/100  01/10/20 (!) 144/97   Wt Readings from Last 3 Encounters:  08/19/23 188 lb 12.8 oz (85.6 kg)  07/11/20 195 lb 9.6 oz (88.7 kg)  06/27/20 195 lb (88.5 kg)      Physical Exam Vitals and nursing note reviewed.  Constitutional:  General: She is not in acute distress.    Appearance: She is well-developed. She is not ill-appearing, toxic-appearing or diaphoretic.  HENT:     Mouth/Throat:     Mouth: Mucous membranes are moist.  Eyes:     General: No scleral icterus.    Extraocular Movements: Extraocular movements intact.  Cardiovascular:     Rate and Rhythm: Normal rate.  Pulmonary:     Effort: Pulmonary effort is normal. No respiratory distress.  Abdominal:     General: Abdomen is flat. Bowel sounds are normal. There is no distension. There  are no signs of injury.     Palpations: Abdomen is soft. There is no hepatomegaly or splenomegaly.     Tenderness: There is abdominal tenderness (LLQ >> RLQ) in the right lower quadrant, suprapubic area and left lower quadrant. There is no guarding or rebound. Negative signs include Rovsing's sign and psoas sign.     Hernia: No hernia is present.  Skin:    General: Skin is warm and dry.     Coloration: Skin is not cyanotic or jaundiced.  Neurological:     General: No focal deficit present.     Mental Status: She is alert and oriented to person, place, and time.  Psychiatric:        Mood and Affect: Mood normal.        Behavior: Behavior normal.      Results for orders placed or performed in visit on 08/19/23  POCT Urinalysis Dipstick (Automated)  Result Value Ref Range   Color, UA yellow    Clarity, UA clear    Glucose, UA Negative Negative   Bilirubin, UA negative    Ketones, UA negative    Spec Grav, UA 1.015 1.010 - 1.025   Blood, UA 3+    pH, UA 6.5 5.0 - 8.0   Protein, UA Positive (A) Negative   Urobilinogen, UA negative (A) 0.2 or 1.0 E.U./dL   Nitrite, UA negative    Leukocytes, UA Negative Negative    Last CBC Lab Results  Component Value Date   WBC 8.4 09/17/2021   HGB 13.1 09/17/2021   HCT 38.5 09/17/2021   MCV 82 09/17/2021   MCH 27.8 09/17/2021   RDW 12.1 09/17/2021   PLT 471 (H) 09/17/2021   Last metabolic panel Lab Results  Component Value Date   GLUCOSE 83 09/17/2021   NA 141 09/17/2021   K 3.4 (L) 09/17/2021   CL 101 09/17/2021   CO2 24 09/17/2021   BUN 9 09/17/2021   CREATININE 0.89 09/17/2021   EGFR 85 09/17/2021   CALCIUM 9.9 09/17/2021   PROT 8.0 09/17/2021   ALBUMIN 4.7 09/17/2021   LABGLOB 3.3 09/17/2021   AGRATIO 1.4 09/17/2021   BILITOT 0.4 09/17/2021   ALKPHOS 79 09/17/2021   AST 18 09/17/2021   ALT 19 09/17/2021      The 10-year ASCVD risk score (Arnett DK, et al., 2019) is: 3%  Assessment & Plan:  Lower abdominal  pain -     POCT Urinalysis Dipstick (Automated) -     US  PELVIC COMPLETE WITH TRANSVAGINAL; Future -     Etodolac; Take 1 tablet (500 mg total) by mouth 2 (two) times daily with a meal.  Dispense: 30 tablet; Refill: 0 -     tiZANidine HCl; Take 1 tablet (4 mg total) by mouth every 8 (eight) hours as needed.  Dispense: 30 tablet; Refill: 0  Pelvic cramping  Assessment and Plan    Suspected  L Ovarian cyst Intermittent lower abdominal cramping likely due to physiological or hemorrhagic ovarian cyst. Imaging deferred unless symptoms worsen. - Prescribed etodolac twice daily with food while symptomatic. - Advised use of heating pad or microwavable heat pack for discomfort. - Recommended Epsom salt baths for cramping relief. - Prescribed muscle relaxer if anti-inflammatory is ineffective. - Instructed to seek imaging if symptoms worsen or persist. - Advised ER visit if symptoms acutely worsen or become severe.         No follow-ups on file.   Mandy Second, PA

## 2023-09-03 ENCOUNTER — Ambulatory Visit (HOSPITAL_BASED_OUTPATIENT_CLINIC_OR_DEPARTMENT_OTHER)
Admission: RE | Admit: 2023-09-03 | Discharge: 2023-09-03 | Disposition: A | Discharge status: Home / Self Care | Source: Ambulatory Visit | Attending: Urgent Care | Admitting: Urgent Care

## 2023-09-03 ENCOUNTER — Other Ambulatory Visit (HOSPITAL_BASED_OUTPATIENT_CLINIC_OR_DEPARTMENT_OTHER): Payer: Self-pay

## 2023-09-03 ENCOUNTER — Ambulatory Visit: Payer: Self-pay | Admitting: Urgent Care

## 2023-09-03 DIAGNOSIS — R1032 Left lower quadrant pain: Secondary | ICD-10-CM | POA: Diagnosis not present

## 2023-09-03 DIAGNOSIS — D259 Leiomyoma of uterus, unspecified: Secondary | ICD-10-CM | POA: Diagnosis not present

## 2023-09-03 DIAGNOSIS — R1030 Lower abdominal pain, unspecified: Secondary | ICD-10-CM | POA: Insufficient documentation

## 2023-09-03 DIAGNOSIS — N858 Other specified noninflammatory disorders of uterus: Secondary | ICD-10-CM | POA: Diagnosis not present

## 2023-09-09 ENCOUNTER — Other Ambulatory Visit (HOSPITAL_BASED_OUTPATIENT_CLINIC_OR_DEPARTMENT_OTHER): Payer: Self-pay

## 2023-09-09 MED ORDER — AMOXICILLIN 250 MG PO CAPS
250.0000 mg | ORAL_CAPSULE | Freq: Three times a day (TID) | ORAL | 0 refills | Status: AC
  Filled 2023-09-09: qty 15, 5d supply, fill #0

## 2023-09-17 ENCOUNTER — Other Ambulatory Visit: Payer: Self-pay | Admitting: Urgent Care

## 2023-09-17 ENCOUNTER — Other Ambulatory Visit (HOSPITAL_BASED_OUTPATIENT_CLINIC_OR_DEPARTMENT_OTHER): Payer: Self-pay

## 2023-09-17 DIAGNOSIS — R1030 Lower abdominal pain, unspecified: Secondary | ICD-10-CM

## 2023-09-17 MED ORDER — ETODOLAC 500 MG PO TABS
500.0000 mg | ORAL_TABLET | Freq: Two times a day (BID) | ORAL | 2 refills | Status: AC
Start: 2023-09-17 — End: ?
  Filled 2023-09-17: qty 30, 15d supply, fill #0

## 2023-09-17 NOTE — Progress Notes (Signed)
 Pt requesting refill of etodolac  for dental pain. Called in to pharmacy

## 2023-10-07 ENCOUNTER — Other Ambulatory Visit (HOSPITAL_BASED_OUTPATIENT_CLINIC_OR_DEPARTMENT_OTHER): Payer: Self-pay

## 2023-10-07 MED ORDER — TRAMADOL HCL 50 MG PO TABS
50.0000 mg | ORAL_TABLET | ORAL | 0 refills | Status: AC | PRN
  Filled 2023-10-07: qty 8, 2d supply, fill #0

## 2023-10-21 ENCOUNTER — Other Ambulatory Visit (HOSPITAL_BASED_OUTPATIENT_CLINIC_OR_DEPARTMENT_OTHER): Payer: Self-pay

## 2023-10-21 MED ORDER — BISOPROLOL-HYDROCHLOROTHIAZIDE 5-6.25 MG PO TABS
1.0000 | ORAL_TABLET | Freq: Every day | ORAL | 2 refills | Status: AC
  Filled 2023-10-21: qty 90, 90d supply, fill #0

## 2023-10-26 ENCOUNTER — Other Ambulatory Visit (HOSPITAL_BASED_OUTPATIENT_CLINIC_OR_DEPARTMENT_OTHER): Payer: Self-pay

## 2023-10-26 DIAGNOSIS — F902 Attention-deficit hyperactivity disorder, combined type: Secondary | ICD-10-CM | POA: Diagnosis not present

## 2023-10-26 DIAGNOSIS — Z79899 Other long term (current) drug therapy: Secondary | ICD-10-CM | POA: Diagnosis not present

## 2023-10-26 DIAGNOSIS — F419 Anxiety disorder, unspecified: Secondary | ICD-10-CM | POA: Diagnosis not present

## 2023-10-26 MED ORDER — METHYLPHENIDATE HCL ER (CD) 30 MG PO CPCR
30.0000 mg | ORAL_CAPSULE | Freq: Every day | ORAL | 0 refills | Status: DC
  Filled 2023-10-26: qty 30, 30d supply, fill #0

## 2023-10-28 ENCOUNTER — Other Ambulatory Visit (HOSPITAL_COMMUNITY): Payer: Self-pay

## 2023-11-08 ENCOUNTER — Encounter (HOSPITAL_BASED_OUTPATIENT_CLINIC_OR_DEPARTMENT_OTHER): Payer: Self-pay

## 2023-11-08 ENCOUNTER — Other Ambulatory Visit (HOSPITAL_BASED_OUTPATIENT_CLINIC_OR_DEPARTMENT_OTHER): Payer: Self-pay

## 2023-11-09 ENCOUNTER — Other Ambulatory Visit (HOSPITAL_BASED_OUTPATIENT_CLINIC_OR_DEPARTMENT_OTHER): Payer: Self-pay

## 2023-11-09 MED ORDER — SERTRALINE HCL 100 MG PO TABS
150.0000 mg | ORAL_TABLET | Freq: Every day | ORAL | 3 refills | Status: AC
  Filled 2023-11-09: qty 135, 90d supply, fill #0

## 2023-11-17 ENCOUNTER — Other Ambulatory Visit

## 2023-11-17 DIAGNOSIS — Z7689 Persons encountering health services in other specified circumstances: Secondary | ICD-10-CM | POA: Diagnosis not present

## 2023-11-18 ENCOUNTER — Other Ambulatory Visit

## 2023-11-18 ENCOUNTER — Other Ambulatory Visit: Payer: Self-pay | Admitting: Family Medicine

## 2023-11-18 DIAGNOSIS — Z1231 Encounter for screening mammogram for malignant neoplasm of breast: Secondary | ICD-10-CM

## 2023-11-19 ENCOUNTER — Ambulatory Visit
Admission: RE | Admit: 2023-11-19 | Discharge: 2023-11-19 | Disposition: A | Discharge status: Home / Self Care | Source: Ambulatory Visit | Attending: Family Medicine | Admitting: Family Medicine

## 2023-11-19 DIAGNOSIS — Z1231 Encounter for screening mammogram for malignant neoplasm of breast: Secondary | ICD-10-CM

## 2023-11-24 ENCOUNTER — Other Ambulatory Visit (HOSPITAL_BASED_OUTPATIENT_CLINIC_OR_DEPARTMENT_OTHER): Payer: Self-pay

## 2023-11-24 DIAGNOSIS — Z0000 Encounter for general adult medical examination without abnormal findings: Secondary | ICD-10-CM | POA: Diagnosis not present

## 2023-11-24 DIAGNOSIS — E7800 Pure hypercholesterolemia, unspecified: Secondary | ICD-10-CM | POA: Diagnosis not present

## 2023-11-24 DIAGNOSIS — F909 Attention-deficit hyperactivity disorder, unspecified type: Secondary | ICD-10-CM | POA: Diagnosis not present

## 2023-11-24 DIAGNOSIS — E559 Vitamin D deficiency, unspecified: Secondary | ICD-10-CM | POA: Diagnosis not present

## 2023-11-24 MED ORDER — VITAMIN D (ERGOCALCIFEROL) 1.25 MG (50000 UNIT) PO CAPS
50000.0000 [IU] | ORAL_CAPSULE | ORAL | 3 refills | Status: AC
  Filled 2023-11-24: qty 12, 84d supply, fill #0

## 2023-12-06 ENCOUNTER — Other Ambulatory Visit (HOSPITAL_BASED_OUTPATIENT_CLINIC_OR_DEPARTMENT_OTHER): Payer: Self-pay

## 2023-12-06 MED ORDER — METHYLPHENIDATE HCL ER (CD) 30 MG PO CPCR
30.0000 mg | ORAL_CAPSULE | Freq: Every day | ORAL | 0 refills | Status: DC
  Filled 2023-12-06: qty 30, 30d supply, fill #0

## 2024-01-11 ENCOUNTER — Other Ambulatory Visit (HOSPITAL_BASED_OUTPATIENT_CLINIC_OR_DEPARTMENT_OTHER): Payer: Self-pay

## 2024-01-12 ENCOUNTER — Other Ambulatory Visit (HOSPITAL_BASED_OUTPATIENT_CLINIC_OR_DEPARTMENT_OTHER): Payer: Self-pay

## 2024-01-12 MED ORDER — METHYLPHENIDATE HCL ER (CD) 30 MG PO CPCR
30.0000 mg | ORAL_CAPSULE | Freq: Every day | ORAL | 0 refills | Status: DC
Start: 2024-01-12 — End: 2024-02-21
  Filled 2024-01-12: qty 30, 30d supply, fill #0

## 2024-02-21 ENCOUNTER — Other Ambulatory Visit: Payer: Self-pay

## 2024-02-21 ENCOUNTER — Other Ambulatory Visit (HOSPITAL_BASED_OUTPATIENT_CLINIC_OR_DEPARTMENT_OTHER): Payer: Self-pay

## 2024-02-21 DIAGNOSIS — F419 Anxiety disorder, unspecified: Secondary | ICD-10-CM | POA: Diagnosis not present

## 2024-02-21 DIAGNOSIS — Z79899 Other long term (current) drug therapy: Secondary | ICD-10-CM | POA: Diagnosis not present

## 2024-02-21 DIAGNOSIS — F902 Attention-deficit hyperactivity disorder, combined type: Secondary | ICD-10-CM | POA: Diagnosis not present

## 2024-02-21 MED ORDER — METHYLPHENIDATE HCL ER (CD) 30 MG PO CPCR
30.0000 mg | ORAL_CAPSULE | Freq: Every morning | ORAL | 0 refills | Status: AC
  Filled 2024-02-21: qty 30, 30d supply, fill #0

## 2024-02-22 DIAGNOSIS — F902 Attention-deficit hyperactivity disorder, combined type: Secondary | ICD-10-CM | POA: Diagnosis not present

## 2024-02-22 DIAGNOSIS — Z79899 Other long term (current) drug therapy: Secondary | ICD-10-CM | POA: Diagnosis not present
